# Patient Record
Sex: Female | Born: 1955
Health system: Southern US, Community
[De-identification: ages and names within clinical notes are randomized; demographics above are authoritative.]

## PROBLEM LIST (undated history)

## (undated) DIAGNOSIS — N1831 Chronic kidney disease, stage 3a: Secondary | ICD-10-CM

## (undated) DIAGNOSIS — E781 Pure hyperglyceridemia: Secondary | ICD-10-CM

## (undated) DIAGNOSIS — I739 Peripheral vascular disease, unspecified: Secondary | ICD-10-CM

## (undated) DIAGNOSIS — H9191 Unspecified hearing loss, right ear: Secondary | ICD-10-CM

## (undated) DIAGNOSIS — E119 Type 2 diabetes mellitus without complications: Secondary | ICD-10-CM

## (undated) DIAGNOSIS — I714 Abdominal aortic aneurysm, without rupture, unspecified: Secondary | ICD-10-CM

## (undated) DIAGNOSIS — K219 Gastro-esophageal reflux disease without esophagitis: Secondary | ICD-10-CM

## (undated) DIAGNOSIS — E785 Hyperlipidemia, unspecified: Secondary | ICD-10-CM

## (undated) DIAGNOSIS — I10 Essential (primary) hypertension: Secondary | ICD-10-CM

## (undated) DIAGNOSIS — I700 Atherosclerosis of aorta: Secondary | ICD-10-CM

## (undated) DIAGNOSIS — G43909 Migraine, unspecified, not intractable, without status migrainosus: Secondary | ICD-10-CM

## (undated) HISTORY — DX: Hyperlipidemia, unspecified: E78.5

## (undated) HISTORY — PX: APPENDECTOMY: SHX54

## (undated) HISTORY — PX: LAPAROTOMY: SHX154

## (undated) HISTORY — DX: Unspecified hearing loss, right ear: H91.91

## (undated) HISTORY — DX: Migraine, unspecified, not intractable, without status migrainosus: G43.909

## (undated) HISTORY — DX: Atherosclerosis of aorta: I70.0

## (undated) HISTORY — DX: Chronic kidney disease, stage 3a: N18.31

## (undated) HISTORY — DX: Peripheral vascular disease, unspecified: I73.9

## (undated) HISTORY — DX: Essential (primary) hypertension: I10

## (undated) HISTORY — DX: Abdominal aortic aneurysm, without rupture: I71.4

## (undated) HISTORY — PX: OTHER SURGICAL HISTORY: SHX169

## (undated) HISTORY — PX: ORTHOPEDIC SURGERY: SHX850

## (undated) HISTORY — DX: Abdominal aortic aneurysm, without rupture, unspecified: I71.40

## (undated) HISTORY — DX: Gastro-esophageal reflux disease without esophagitis: K21.9

## (undated) HISTORY — DX: Pure hyperglyceridemia: E78.1

---

## 1998-09-04 ENCOUNTER — Ambulatory Visit (HOSPITAL_COMMUNITY): Admission: RE | Admit: 1998-09-04 | Discharge: 1998-09-04 | Payer: Self-pay | Admitting: Obstetrics & Gynecology

## 1998-12-27 ENCOUNTER — Ambulatory Visit (HOSPITAL_BASED_OUTPATIENT_CLINIC_OR_DEPARTMENT_OTHER): Admission: RE | Admit: 1998-12-27 | Discharge: 1998-12-27 | Payer: Self-pay | Admitting: Orthopedic Surgery

## 1999-10-04 ENCOUNTER — Ambulatory Visit (HOSPITAL_COMMUNITY): Admission: RE | Admit: 1999-10-04 | Discharge: 1999-10-04 | Payer: Self-pay | Admitting: Obstetrics & Gynecology

## 1999-10-04 ENCOUNTER — Encounter: Payer: Self-pay | Admitting: Obstetrics & Gynecology

## 1999-11-12 ENCOUNTER — Other Ambulatory Visit: Admission: RE | Admit: 1999-11-12 | Discharge: 1999-11-12 | Payer: Self-pay | Admitting: Obstetrics & Gynecology

## 2000-02-26 ENCOUNTER — Encounter: Payer: Self-pay | Admitting: Family Medicine

## 2000-02-26 ENCOUNTER — Encounter: Admission: RE | Admit: 2000-02-26 | Discharge: 2000-02-26 | Payer: Self-pay | Admitting: Family Medicine

## 2000-10-06 ENCOUNTER — Encounter: Payer: Self-pay | Admitting: Obstetrics & Gynecology

## 2000-10-06 ENCOUNTER — Ambulatory Visit (HOSPITAL_COMMUNITY): Admission: RE | Admit: 2000-10-06 | Discharge: 2000-10-06 | Payer: Self-pay | Admitting: Obstetrics & Gynecology

## 2000-11-30 ENCOUNTER — Other Ambulatory Visit: Admission: RE | Admit: 2000-11-30 | Discharge: 2000-11-30 | Payer: Self-pay | Admitting: Obstetrics & Gynecology

## 2001-10-21 ENCOUNTER — Ambulatory Visit (HOSPITAL_COMMUNITY): Admission: RE | Admit: 2001-10-21 | Discharge: 2001-10-21 | Payer: Self-pay | Admitting: Obstetrics & Gynecology

## 2001-10-21 ENCOUNTER — Encounter: Payer: Self-pay | Admitting: Obstetrics & Gynecology

## 2001-10-28 ENCOUNTER — Encounter: Admission: RE | Admit: 2001-10-28 | Discharge: 2001-10-28 | Payer: Self-pay | Admitting: Urology

## 2001-10-28 ENCOUNTER — Encounter: Payer: Self-pay | Admitting: Urology

## 2001-12-23 ENCOUNTER — Other Ambulatory Visit: Admission: RE | Admit: 2001-12-23 | Discharge: 2001-12-23 | Payer: Self-pay | Admitting: Obstetrics & Gynecology

## 2002-05-18 ENCOUNTER — Encounter: Payer: Self-pay | Admitting: Family Medicine

## 2002-05-18 ENCOUNTER — Encounter: Admission: RE | Admit: 2002-05-18 | Discharge: 2002-05-18 | Payer: Self-pay | Admitting: Family Medicine

## 2002-08-11 ENCOUNTER — Encounter (INDEPENDENT_AMBULATORY_CARE_PROVIDER_SITE_OTHER): Payer: Self-pay | Admitting: *Deleted

## 2002-08-11 ENCOUNTER — Ambulatory Visit (HOSPITAL_COMMUNITY): Admission: RE | Admit: 2002-08-11 | Discharge: 2002-08-11 | Payer: Self-pay | Admitting: *Deleted

## 2002-11-02 ENCOUNTER — Encounter: Payer: Self-pay | Admitting: Obstetrics & Gynecology

## 2002-11-02 ENCOUNTER — Ambulatory Visit (HOSPITAL_COMMUNITY): Admission: RE | Admit: 2002-11-02 | Discharge: 2002-11-02 | Payer: Self-pay | Admitting: Obstetrics & Gynecology

## 2003-01-03 ENCOUNTER — Encounter: Admission: RE | Admit: 2003-01-03 | Discharge: 2003-01-03 | Payer: Self-pay | Admitting: *Deleted

## 2003-01-03 ENCOUNTER — Encounter: Payer: Self-pay | Admitting: *Deleted

## 2003-02-01 ENCOUNTER — Other Ambulatory Visit: Admission: RE | Admit: 2003-02-01 | Discharge: 2003-02-01 | Payer: Self-pay | Admitting: Obstetrics & Gynecology

## 2003-08-15 ENCOUNTER — Ambulatory Visit (HOSPITAL_COMMUNITY): Admission: RE | Admit: 2003-08-15 | Discharge: 2003-08-15 | Payer: Self-pay | Admitting: Family Medicine

## 2003-11-06 ENCOUNTER — Ambulatory Visit (HOSPITAL_COMMUNITY): Admission: RE | Admit: 2003-11-06 | Discharge: 2003-11-06 | Payer: Self-pay | Admitting: Obstetrics & Gynecology

## 2003-11-16 ENCOUNTER — Ambulatory Visit (HOSPITAL_COMMUNITY): Admission: RE | Admit: 2003-11-16 | Discharge: 2003-11-16 | Payer: Self-pay | Admitting: Obstetrics & Gynecology

## 2004-01-09 ENCOUNTER — Ambulatory Visit (HOSPITAL_COMMUNITY): Admission: RE | Admit: 2004-01-09 | Discharge: 2004-01-09 | Payer: Self-pay | Admitting: Obstetrics & Gynecology

## 2004-04-30 ENCOUNTER — Other Ambulatory Visit: Admission: RE | Admit: 2004-04-30 | Discharge: 2004-04-30 | Payer: Self-pay | Admitting: Obstetrics & Gynecology

## 2004-08-19 ENCOUNTER — Ambulatory Visit: Payer: Self-pay | Admitting: Internal Medicine

## 2004-09-25 ENCOUNTER — Ambulatory Visit: Payer: Self-pay | Admitting: Cardiology

## 2004-09-30 ENCOUNTER — Ambulatory Visit: Payer: Self-pay | Admitting: Cardiology

## 2004-11-13 ENCOUNTER — Ambulatory Visit (HOSPITAL_COMMUNITY): Admission: RE | Admit: 2004-11-13 | Discharge: 2004-11-13 | Payer: Self-pay | Admitting: Obstetrics & Gynecology

## 2004-12-06 ENCOUNTER — Ambulatory Visit: Payer: Self-pay | Admitting: Cardiology

## 2005-01-09 ENCOUNTER — Ambulatory Visit: Payer: Self-pay | Admitting: Cardiology

## 2005-02-24 ENCOUNTER — Ambulatory Visit: Payer: Self-pay | Admitting: Cardiology

## 2005-03-18 ENCOUNTER — Ambulatory Visit: Payer: Self-pay | Admitting: Cardiology

## 2005-04-07 ENCOUNTER — Ambulatory Visit: Payer: Self-pay | Admitting: Cardiology

## 2005-05-05 ENCOUNTER — Ambulatory Visit: Payer: Self-pay | Admitting: Internal Medicine

## 2005-06-09 ENCOUNTER — Other Ambulatory Visit: Admission: RE | Admit: 2005-06-09 | Discharge: 2005-06-09 | Payer: Self-pay | Admitting: Obstetrics & Gynecology

## 2005-06-19 ENCOUNTER — Ambulatory Visit: Payer: Self-pay | Admitting: Cardiology

## 2005-09-01 ENCOUNTER — Ambulatory Visit: Payer: Self-pay | Admitting: Cardiology

## 2005-09-06 ENCOUNTER — Emergency Department (HOSPITAL_COMMUNITY): Admission: EM | Admit: 2005-09-06 | Discharge: 2005-09-06 | Payer: Self-pay | Admitting: Emergency Medicine

## 2005-11-27 ENCOUNTER — Ambulatory Visit (HOSPITAL_COMMUNITY): Admission: RE | Admit: 2005-11-27 | Discharge: 2005-11-27 | Payer: Self-pay | Admitting: Obstetrics & Gynecology

## 2005-12-04 ENCOUNTER — Encounter: Admission: RE | Admit: 2005-12-04 | Discharge: 2005-12-04 | Payer: Self-pay | Admitting: Obstetrics & Gynecology

## 2006-01-21 ENCOUNTER — Ambulatory Visit: Payer: Self-pay

## 2006-07-01 ENCOUNTER — Encounter: Admission: RE | Admit: 2006-07-01 | Discharge: 2006-07-01 | Payer: Self-pay | Admitting: Interventional Radiology

## 2006-10-05 ENCOUNTER — Ambulatory Visit: Payer: Self-pay | Admitting: Cardiology

## 2006-10-05 LAB — CONVERTED CEMR LAB
ALT: 69 units/L — ABNORMAL HIGH (ref 0–40)
AST: 56 units/L — ABNORMAL HIGH (ref 0–37)
Albumin: 4 g/dL (ref 3.5–5.2)
Alkaline Phosphatase: 56 units/L (ref 39–117)
Bilirubin, Direct: 0.1 mg/dL (ref 0.0–0.3)
Chol/HDL Ratio, serum: 8.2
Cholesterol: 249 mg/dL (ref 0–200)
HDL: 30.5 mg/dL — ABNORMAL LOW (ref >39.0)
LDL DIRECT: 94.4 mg/dL
Total Bilirubin: 0.8 mg/dL (ref 0.3–1.2)
Total Protein: 6.6 g/dL (ref 6.0–8.3)
Triglyceride fasting, serum: 923 mg/dL (ref 0–149)
VLDL: 185 mg/dL — ABNORMAL HIGH (ref 0–40)

## 2006-10-15 ENCOUNTER — Ambulatory Visit: Payer: Self-pay | Admitting: Internal Medicine

## 2006-11-11 ENCOUNTER — Ambulatory Visit: Payer: Self-pay | Admitting: *Deleted

## 2006-11-11 LAB — CONVERTED CEMR LAB
ALT: 62 units/L — ABNORMAL HIGH (ref 0–40)
AST: 63 units/L — ABNORMAL HIGH (ref 0–37)
Albumin: 4.2 g/dL (ref 3.5–5.2)
Alkaline Phosphatase: 43 units/L (ref 39–117)
Bilirubin, Direct: 0.1 mg/dL (ref 0.0–0.3)
Cholesterol: 242 mg/dL (ref 0–200)
Direct LDL: 159.7 mg/dL
HDL: 29.3 mg/dL — ABNORMAL LOW (ref >39.0)
Total Bilirubin: 0.7 mg/dL (ref 0.3–1.2)
Total CHOL/HDL Ratio: 8.3
Total Protein: 7.2 g/dL (ref 6.0–8.3)
Triglycerides: 407 mg/dL (ref 0–149)
VLDL: 81 mg/dL — ABNORMAL HIGH (ref 0–40)

## 2006-11-16 ENCOUNTER — Ambulatory Visit: Payer: Self-pay | Admitting: Cardiology

## 2006-12-09 ENCOUNTER — Ambulatory Visit (HOSPITAL_COMMUNITY): Admission: RE | Admit: 2006-12-09 | Discharge: 2006-12-09 | Payer: Self-pay | Admitting: Obstetrics & Gynecology

## 2006-12-10 ENCOUNTER — Ambulatory Visit: Payer: Self-pay | Admitting: *Deleted

## 2006-12-10 LAB — CONVERTED CEMR LAB
ALT: 41 units/L — ABNORMAL HIGH (ref 0–40)
AST: 47 units/L — ABNORMAL HIGH (ref 0–37)
Albumin: 3.7 g/dL (ref 3.5–5.2)
Alkaline Phosphatase: 42 units/L (ref 39–117)
Bilirubin, Direct: 0.1 mg/dL (ref 0.0–0.3)
Cholesterol: 178 mg/dL (ref 0–200)
Direct LDL: 104.6 mg/dL
HDL: 24.9 mg/dL — ABNORMAL LOW (ref >39.0)
Total Bilirubin: 0.6 mg/dL (ref 0.3–1.2)
Total CHOL/HDL Ratio: 7.1
Total Protein: 6.6 g/dL (ref 6.0–8.3)
Triglycerides: 361 mg/dL (ref 0–149)
VLDL: 72 mg/dL — ABNORMAL HIGH (ref 0–40)

## 2006-12-14 ENCOUNTER — Ambulatory Visit: Payer: Self-pay | Admitting: Cardiology

## 2006-12-29 ENCOUNTER — Ambulatory Visit: Payer: Self-pay | Admitting: Internal Medicine

## 2006-12-29 LAB — CONVERTED CEMR LAB
ALT: 47 units/L — ABNORMAL HIGH (ref 0–40)
AST: 54 units/L — ABNORMAL HIGH (ref 0–37)
Albumin: 4.2 g/dL (ref 3.5–5.2)
Alkaline Phosphatase: 46 units/L (ref 39–117)
Anti Nuclear Antibody(ANA): NEGATIVE
BUN: 15 mg/dL (ref 6–23)
Basophils Absolute: 0.1 10*3/uL (ref 0.0–0.1)
Basophils Relative: 1.2 % — ABNORMAL HIGH (ref 0.0–1.0)
Bilirubin, Direct: 0.1 mg/dL (ref 0.0–0.3)
CO2: 30 meq/L (ref 19–32)
Calcium: 9.8 mg/dL (ref 8.4–10.5)
Ceruloplasmin: 57 mg/dL (ref 21–63)
Chloride: 106 meq/L (ref 96–112)
Creatinine, Ser: 0.8 mg/dL (ref 0.4–1.2)
Eosinophils Absolute: 0.1 10*3/uL (ref 0.0–0.6)
Eosinophils Relative: 1.4 % (ref 0.0–5.0)
Ferritin: 320.3 ng/mL — ABNORMAL HIGH (ref 10.0–291.0)
GFR calc Af Amer: 97 mL/min
GFR calc non Af Amer: 80 mL/min
Glucose, Bld: 130 mg/dL — ABNORMAL HIGH (ref 70–99)
HCT: 38.8 % (ref 36.0–46.0)
HCV Ab: NEGATIVE
Hemoglobin: 14.2 g/dL (ref 12.0–15.0)
Hep B S Ab: NEGATIVE
Hepatitis B Surface Ag: NEGATIVE
INR: 1 (ref 0.9–2.0)
Iron: 77 ug/dL (ref 42–145)
Lymphocytes Relative: 38.5 % (ref 12.0–46.0)
MCHC: 36.6 g/dL (ref 30.0–36.0)
MCV: 99.6 fL (ref 78.0–100.0)
Monocytes Absolute: 0.3 10*3/uL (ref 0.2–0.7)
Monocytes Relative: 3.4 % (ref 3.0–11.0)
Neutro Abs: 5.1 10*3/uL (ref 1.4–7.7)
Neutrophils Relative %: 55.5 % (ref 43.0–77.0)
Platelets: 280 10*3/uL (ref 150–400)
Potassium: 3.5 meq/L (ref 3.5–5.1)
Prothrombin Time: 12.1 s (ref 10.0–14.0)
RBC: 3.9 M/uL (ref 3.87–5.11)
RDW: 11.3 % — ABNORMAL LOW (ref 11.5–14.6)
Saturation Ratios: 15.9 % — ABNORMAL LOW (ref 20.0–50.0)
Sodium: 143 meq/L (ref 135–145)
TSH: 3.5 microintl units/mL (ref 0.35–5.50)
Total Bilirubin: 0.7 mg/dL (ref 0.3–1.2)
Total Protein: 6.9 g/dL (ref 6.0–8.3)
Transferrin: 345.5 mg/dL (ref >=212.0)
WBC: 9.1 10*3/uL (ref 4.5–10.5)

## 2007-01-04 ENCOUNTER — Ambulatory Visit: Payer: Self-pay | Admitting: Internal Medicine

## 2007-01-26 ENCOUNTER — Ambulatory Visit: Payer: Self-pay | Admitting: Internal Medicine

## 2007-05-25 ENCOUNTER — Ambulatory Visit: Payer: Self-pay | Admitting: Internal Medicine

## 2007-05-25 LAB — CONVERTED CEMR LAB
ALT: 43 units/L — ABNORMAL HIGH (ref 0–35)
AST: 44 units/L — ABNORMAL HIGH (ref 0–37)
Albumin: 4.1 g/dL (ref 3.5–5.2)
Alkaline Phosphatase: 40 units/L (ref 39–117)
Bilirubin, Direct: 0.1 mg/dL (ref 0.0–0.3)
Cholesterol: 201 mg/dL (ref 0–200)
Direct LDL: 118.4 mg/dL
HDL: 16.5 mg/dL — ABNORMAL LOW (ref >39.0)
Total Bilirubin: 0.6 mg/dL (ref 0.3–1.2)
Total CHOL/HDL Ratio: 12.2
Total Protein: 6.8 g/dL (ref 6.0–8.3)
Triglycerides: 637 mg/dL (ref 0–149)
VLDL: 127 mg/dL — ABNORMAL HIGH (ref 0–40)

## 2007-05-27 ENCOUNTER — Ambulatory Visit: Payer: Self-pay | Admitting: Cardiology

## 2007-07-16 ENCOUNTER — Ambulatory Visit: Payer: Self-pay | Admitting: Internal Medicine

## 2007-07-16 LAB — CONVERTED CEMR LAB
ALT: 37 units/L — ABNORMAL HIGH (ref 0–35)
AST: 44 units/L — ABNORMAL HIGH (ref 0–37)
Albumin: 4.2 g/dL (ref 3.5–5.2)
Alkaline Phosphatase: 36 units/L — ABNORMAL LOW (ref 39–117)
Bilirubin, Direct: 0.1 mg/dL (ref 0.0–0.3)
Cholesterol: 207 mg/dL (ref 0–200)
Direct LDL: 145.7 mg/dL
HDL: 22.1 mg/dL — ABNORMAL LOW (ref >39.0)
Total Bilirubin: 0.6 mg/dL (ref 0.3–1.2)
Total CHOL/HDL Ratio: 9.4
Total Protein: 7.1 g/dL (ref 6.0–8.3)
Triglycerides: 209 mg/dL (ref 0–149)
VLDL: 42 mg/dL — ABNORMAL HIGH (ref 0–40)

## 2007-10-15 ENCOUNTER — Ambulatory Visit: Payer: Self-pay | Admitting: Internal Medicine

## 2007-10-19 LAB — CONVERTED CEMR LAB
ALT: 40 units/L — ABNORMAL HIGH (ref 0–35)
AST: 48 units/L — ABNORMAL HIGH (ref 0–37)
Albumin: 4.2 g/dL (ref 3.5–5.2)
Alkaline Phosphatase: 31 units/L — ABNORMAL LOW (ref 39–117)
Bilirubin, Direct: 0.1 mg/dL (ref 0.0–0.3)
Cholesterol: 211 mg/dL (ref 0–200)
Direct LDL: 138.6 mg/dL
HDL: 23.8 mg/dL — ABNORMAL LOW (ref >39.0)
Total Bilirubin: 0.7 mg/dL (ref 0.3–1.2)
Total CHOL/HDL Ratio: 8.9
Total Protein: 7.3 g/dL (ref 6.0–8.3)
Triglycerides: 226 mg/dL (ref 0–149)
VLDL: 45 mg/dL — ABNORMAL HIGH (ref 0–40)

## 2007-10-21 ENCOUNTER — Ambulatory Visit: Payer: Self-pay | Admitting: Cardiology

## 2007-12-28 ENCOUNTER — Ambulatory Visit (HOSPITAL_COMMUNITY): Admission: RE | Admit: 2007-12-28 | Discharge: 2007-12-28 | Payer: Self-pay | Admitting: Obstetrics & Gynecology

## 2008-01-06 ENCOUNTER — Ambulatory Visit: Payer: Self-pay | Admitting: Cardiology

## 2008-01-06 LAB — CONVERTED CEMR LAB
ALT: 41 units/L — ABNORMAL HIGH (ref 0–35)
AST: 47 units/L — ABNORMAL HIGH (ref 0–37)
Albumin: 4.1 g/dL (ref 3.5–5.2)
Alkaline Phosphatase: 33 units/L — ABNORMAL LOW (ref 39–117)
Bilirubin, Direct: 0.1 mg/dL (ref 0.0–0.3)
Cholesterol: 198 mg/dL (ref 0–200)
Direct LDL: 138.5 mg/dL
HDL: 26 mg/dL — ABNORMAL LOW (ref >39.0)
Total Bilirubin: 0.5 mg/dL (ref 0.3–1.2)
Total CHOL/HDL Ratio: 7.6
Total CK: 203 units/L (ref 7–177)
Total Protein: 7.4 g/dL (ref 6.0–8.3)
Triglycerides: 208 mg/dL (ref 0–149)
VLDL: 42 mg/dL — ABNORMAL HIGH (ref 0–40)

## 2008-01-17 ENCOUNTER — Ambulatory Visit: Payer: Self-pay | Admitting: Cardiovascular Disease

## 2008-06-22 ENCOUNTER — Ambulatory Visit: Payer: Self-pay | Admitting: Internal Medicine

## 2008-06-23 ENCOUNTER — Ambulatory Visit: Payer: Self-pay | Admitting: Internal Medicine

## 2008-06-28 LAB — CONVERTED CEMR LAB
ALT: 30 units/L (ref 0–35)
AST: 37 units/L (ref 0–37)
Albumin: 4.2 g/dL (ref 3.5–5.2)
Alkaline Phosphatase: 30 units/L — ABNORMAL LOW (ref 39–117)
Bilirubin, Direct: 0.1 mg/dL (ref 0.0–0.3)
Cholesterol: 188 mg/dL (ref 0–200)
Direct LDL: 119.5 mg/dL
HDL: 24 mg/dL — ABNORMAL LOW (ref >39.0)
Total Bilirubin: 0.8 mg/dL (ref 0.3–1.2)
Total CHOL/HDL Ratio: 7.8
Total Protein: 7.3 g/dL (ref 6.0–8.3)
Triglycerides: 229 mg/dL (ref 0–149)
VLDL: 46 mg/dL — ABNORMAL HIGH (ref 0–40)

## 2008-06-29 ENCOUNTER — Ambulatory Visit: Payer: Self-pay | Admitting: Cardiology

## 2008-11-03 ENCOUNTER — Ambulatory Visit: Payer: Self-pay

## 2008-11-03 LAB — CONVERTED CEMR LAB
ALT: 31 units/L (ref 0–35)
AST: 34 units/L (ref 0–37)
Albumin: 4.3 g/dL (ref 3.5–5.2)
Alkaline Phosphatase: 34 units/L — ABNORMAL LOW (ref 39–117)
Bilirubin, Direct: 0.1 mg/dL (ref 0.0–0.3)
Cholesterol: 175 mg/dL (ref 0–200)
Direct LDL: 111.1 mg/dL
HDL: 22.2 mg/dL — ABNORMAL LOW (ref >39.0)
Total Bilirubin: 0.8 mg/dL (ref 0.3–1.2)
Total CHOL/HDL Ratio: 7.9
Total Protein: 7.4 g/dL (ref 6.0–8.3)
Triglycerides: 265 mg/dL (ref 0–149)
VLDL: 53 mg/dL — ABNORMAL HIGH (ref 0–40)

## 2008-11-09 ENCOUNTER — Ambulatory Visit: Payer: Self-pay | Admitting: Cardiology

## 2009-02-02 ENCOUNTER — Ambulatory Visit: Payer: Self-pay | Admitting: Cardiology

## 2009-02-07 LAB — CONVERTED CEMR LAB
ALT: 38 units/L — ABNORMAL HIGH (ref 0–35)
AST: 52 units/L — ABNORMAL HIGH (ref 0–37)
Albumin: 4.1 g/dL (ref 3.5–5.2)
Alkaline Phosphatase: 29 units/L — ABNORMAL LOW (ref 39–117)
Bilirubin, Direct: 0.2 mg/dL (ref 0.0–0.3)
Cholesterol: 144 mg/dL (ref 0–200)
Direct LDL: 91.2 mg/dL
HDL: 21.1 mg/dL — ABNORMAL LOW (ref >39.00)
Total Bilirubin: 0.7 mg/dL (ref 0.3–1.2)
Total CHOL/HDL Ratio: 7
Total Protein: 7.2 g/dL (ref 6.0–8.3)
Triglycerides: 270 mg/dL — ABNORMAL HIGH (ref 0.0–149.0)
VLDL: 54 mg/dL — ABNORMAL HIGH (ref 0.0–40.0)

## 2009-02-22 ENCOUNTER — Ambulatory Visit: Payer: Self-pay | Admitting: Internal Medicine

## 2009-02-22 DIAGNOSIS — M359 Systemic involvement of connective tissue, unspecified: Secondary | ICD-10-CM | POA: Insufficient documentation

## 2009-02-22 DIAGNOSIS — E782 Mixed hyperlipidemia: Secondary | ICD-10-CM | POA: Insufficient documentation

## 2009-02-22 DIAGNOSIS — E785 Hyperlipidemia, unspecified: Secondary | ICD-10-CM | POA: Insufficient documentation

## 2009-03-20 ENCOUNTER — Ambulatory Visit (HOSPITAL_COMMUNITY): Admission: RE | Admit: 2009-03-20 | Discharge: 2009-03-20 | Payer: Self-pay | Admitting: Obstetrics & Gynecology

## 2010-04-10 ENCOUNTER — Ambulatory Visit (HOSPITAL_COMMUNITY): Admission: RE | Admit: 2010-04-10 | Discharge: 2010-04-10 | Payer: Self-pay | Admitting: Obstetrics & Gynecology

## 2011-02-04 NOTE — Assessment & Plan Note (Signed)
[facility]                               LIPID CLINIC NOTE   NINI, CAVAN                        MRN:          [id number]  DATE:06/29/2008                            DOB:          1956-05-06    Ms. Malcolm comes in today for followup of her hyperlipidemia therapy  which includes TriCor 145 mg in the evening, fish oil 2 g every evening,  and Crestor 5 mg in the evening 3 times per week.  She has been  compliant with all previous therapies and was tolerating them with no  reports of muscle aches, pains, weakness, fatigue, etc.   Other medications have not changed that include prednisone, atenolol,  indapamide, Zoloft, Nexium, Cenestin, Neurontin, calcium with vitamin D,  multivitamin.   PHYSICAL EXAMINATION:  VITAL SIGNS:  Weight is 159 pounds, blood  pressure is 125/75, heart rate is 64.   LABORATORY DATA:  Total cholesterol 188, triglycerides 229, HDL 24, LDL  119.5.  LFTs are within normal limits.   ASSESSMENT:  Ms. Kittrell' LDL was slightly improved, but not yet at the  goal of less than 100.  Her triglycerides are about the same and not at  goal of less than 150.  Her HDL is not improved and is not at the goal  greater than 50.  She admits to not exercising at all, and her diet  though is not to bad she tries to maintain a heart healthy diet with  little or little to no fast food, chicken, fish, lots of salads, fresh  vegetables eating at home.  She does continue to smoke cigarettes.  She  also has wine pretty much on a daily basis.  Her stress level at home  has been getting little bit better with the adoption of her grandson.  She is interested in quitting smoking and is considering using nicotine  patches.   PLAN:  We will increase Crestor from 5 mg to 5 mg daily and hopes that  will improve all 3 for now rather not yet goal.  I encouraged her to  continue heart healthy diet and also I encouraged her to start walking  at least 3  times a week for 20-30 minutes at a time.  I encouraged her  to get out with her husband in the next Fall weather as well as pushing  the grandson in a stroller might be a good de-stressor in a way to  increase physical activity.  Followup with [place] will be in 4 months at  which time we will get a lipid and liver panel again and make any  changes in needs to  be made at that time and she was instructed to call us with any concerns  or problems in the meantime.      Charolotte Eke, PharmD  Electronically Signed      Rollene Rotunda, MD, [facility]  Electronically Signed   TP/MedQ  DD: 06/29/2008  DT: 06/30/2008  Job #: [id number]

## 2011-02-04 NOTE — Assessment & Plan Note (Signed)
[facility]                               LIPID CLINIC NOTE   Darlene Saunders, Darlene Saunders                        MRN:          [id number]  DATE:01/17/2008                            DOB:          10-31-1955    The patient was seen back in the lipid clinic for further evaluation of  medication titration associated with his hyperlipidemia in the setting  of a fatty liver and multiple medication intolerances.   The patient has been compliant with her therapies which include Crestor  3 times a week and has been feeling and doing well overall.  She has had  no muscle aches, pains, weakness, fatigue or other problems.  She  relates many stressful items that have occurred since our last visit  including the birth of her grandson in April.  The patient and her  husband have had significant levels of stress associated with this and  are working to identify more helpful habits.  The patient is currently  smoking approximately 15 cigarettes each day, an increase from her last  visit.   PAST MEDICAL HISTORY:  1. Pertinent for a fatty liver.  2. Pertinent for depression and the like.   CURRENT MEDICATIONS:  For cholesterol include 3 times a week Crestor 5  mg and TriCor 145 mg daily in the evening.   VITAL SIGNS:  Blood pressure today is 120/72.  Her heart rate is 68.  Her respirations are 17.  Her weight is 156.5 pounds.   The patient will follow up with questions or problems.  In the meantime,  I have asked her to identify how she can improve her healthy habits.   ASSESSMENT:  I have asked the patient to work on cutting down her  smoking, identifying how she is going to best quit.  We have discussed  patches and tablets amongst other therapies.  She will consider these in  the future.  In the meantime, I have asked her to continue her  therapies.  We will check her lipid panel later on but for now we will  only check her liver panel on June 8th at the [facility] lab.   We will do this  to verify that her liver functions are returning into the normal range  or staying consistent and that there is no further therapy modification  that we need to make to improve those.  She will follow up at that time.  I encouraged the patient to seek professional assistance such as  counseling with her increased stress at home with her new  grandchild and the grieving process associated with that.  She seems  very receptive to this plan and will follow up accordingly.  Time spent  with the patient is 45 minutes.      [name], PharmD, BCPS, CPP  Electronically Signed      Rollene Rotunda, MD, [facility]  Electronically Signed   MP/MedQ  DD: 01/17/2008  DT: 01/17/2008  Job #: [id number]   cc:   Wilhemina Bonito. Marina Goodell, MD

## 2011-02-04 NOTE — Assessment & Plan Note (Signed)
[facility]                               LIPID CLINIC NOTE   MURREL, FREET                        MRN:          [id number]  DATE:05/27/2007                            DOB:          01-Sep-1956    This is a return office visit for the lipid clinic.   PAST MEDICAL HISTORY:  1. Hyperlipidemia.  2. Hypertension.  3. Gastroesophageal reflux disease.  4. Chronic nonviral autoimmune disease.   MEDICATIONS:  1. Prednisone 2.5 mg every other day.  2. Atenolol 100 mg daily.  3. Indapamide 2.5 mg daily.  4. Zoloft 100 mg daily.  5. Nexium 40 mg daily.  6. Cenestin 1.25 mg twice daily.  7. Neurontin 800 mg 3 times a day.  8. Calcium with vitamin D daily.  9. Multivitamin daily.  10.Tricor 145 mg daily.  11.Fish oil 1 gm daily.   VITALS:  Weight 157 pounds.  Blood pressure 140/75.  Heart rate 70.   LAB DATA:  Total cholesterol 201, triglycerides 637, HDL 16.5, LDL 118,  AST 44, ALT 43.   ASSESSMENT:  Mr. Darlene Saunders is a pleasant woman who returns to the lipid  clinic today with no chest pain, no shortness of breath, no muscle aches  or pains.  She is compliant with a current medication regimen.  She had  started exercising over the summer by walking 30-45 minutes 4-5 times a  week; however, since school has restarted, she has decreased that to  about three days a week.  She is active in her garden, does lots of  gardening and digging outside.  She eats a fairly low fat, low  carbohydrate diet.  Cooks most of her evening meals but occasionally  fries.  Her biggest vice is cheese, which she tries to limit to 1 ounce  a day.  She also drinks one glass of wine each night of the week;  however, 2-3 times a week, she is drinking two glasses.  Given her  history with increased LFTs, I have encouraged her to decrease that to  one glass of wine a night.   Her total cholesterol is greater than a goal of less than 200.  Triglycerides greater than a goal of  less than 150.  HDL is less than a  goal of greater than 40.  LDL is greater than a goal of less than 100.   Given her history and her hypertriglyceridemia that is not responding to  Tricor therapy alone, I have asked her to increase her fish oil to 1  capsule three times daily with meals in anticipation of having to  increase this in the future.  She is agreeable to make medication  changes.  She also is agreeable to try a low dose statin once a week to  see if her body tolerates, and we will monitor her LFTs closely.   PLAN:  1. Continue exercise regimen.  2. Continue low fat diet, decreasing wine to one glass a night.  3. Continue exercise regimen.  4. Continue Tricor to 145 mg daily.  Add on  Crestor 5 mg once a week.  5. Follow-up visit in six weeks for lipid panel and LFTs and make      adjustments if needed at that time.      Leota Sauers, PharmD  Electronically Signed      Jesse Sans. Daleen Squibb, MD, [facility]  Electronically Signed   LC/MedQ  DD: 05/27/2007  DT: 05/27/2007  Job #: [id number]

## 2011-02-04 NOTE — Assessment & Plan Note (Signed)
[facility]                               LIPID CLINIC NOTE   Darlene Saunders, Darlene Saunders                        MRN:          [id number]  DATE:10/20/2008                            DOB:          05-May-1956    The patient is seen in Lipid Clinic for further evaluation and  medication titration associated with hyperlipidemia in the setting of  abnormal LFTs.  She has been doing well since her last visit.  She has  had no muscle aches, pain, weakness, or other issues.  She has ordered  to decrease her wine intake to some extent.  She increased her last  visit to Crestor 5 mg each day.  She has ordered to increase her heart-  healthy diet and is trying to walk though she has had increased stress.  She has been compliant with her meds.   PAST MEDICAL HISTORY:  Pertinent for hypertension and hyperlipidemia.   CURRENT MEDICATIONS:  Crestor 5 mg daily.   REVIEW OF SYSTEMS:  As stated in the HPI, otherwise negative.   PHYSICAL EXAMINATION:  VITAL SIGNS:  Weight today is 160 pounds, blood  pressure is 120/74, and heart rate is 68.   LABORATORY DATA:  Total cholesterol 188, triglycerides 229, HDL 24, LDL  119.5, and LFTs are within normal limits.   ASSESSMENT:  The patient is moving towards compliance with primary,  secondary, tertiary goals for lipid lowering therapy.  The patient will  call with questions or problems in the meantime.  She will follow up  with this in 3 months.  Continue to improve her diet and consider fish  oil.      [name], PharmD, BCPS, CPP  Electronically Signed      Rollene Rotunda, MD, [facility]  Electronically Signed   MP/MedQ  DD: 10/20/2008  DT: 10/21/2008  Job #: [id number]

## 2011-02-04 NOTE — Assessment & Plan Note (Signed)
[facility]                               LIPID CLINIC NOTE   GULIANNA, HORNSBY                        MRN:          [id number]  DATE:10/21/2007                            DOB:          10-15-1955    This is a return office visit for lipid clinic recheck.   PAST MEDICAL HISTORY:  Hyperlipidemia, hypertension, GERD, chronic  nonviral autoimmune disease, hypertriglyceridemia.   MEDICATIONS:  1. Prednisone 2.5 mg every other day.  2. Atenolol 100 mg daily.  3. Indapamide 2.5 mg daily.  4. Zoloft 100 mg daily.  5. Nexium 40 mg daily.  6. Cenestin 1.25 mg twice daily.  7. Neurontin 800 mg three times daily.  8. Zyrtec 10 mg daily.  9. TriCor 145 mg daily.  10.Calcium 600 mg with vitamin D 400 mg daily.  11.Multivitamin daily.  12.Crestor 5 mg twice weekly.  13.Gaviscon p.r.n.  14.Maxalt p.r.n.  15.Tylenol p.r.n.   Prescriptions filled at [facility] on [facility].   PHYSICAL EXAMINATION:  VITAL SIGNS:  Weight 154 pounds, blood pressure  120/72, heart rate 80.   LABORATORY DATA:  Total cholesterol 211, triglycerides 226, HDL 23.8,  LDL 138.  LFT's within normal limits.   ASSESSMENT:  The patient is a pleasant woman returning to the lipid  clinic today denying chest pain, shortness of breath, or muscle aches  and pains.  For the most part she is compliant, although has ran out of  Crestor for the last week or two.  Currently, otherwise taking TriCor  daily 145 mg and Fish Oil 1 gram daily for her lipid treatment,  currently all in the morning.  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  We discussed contemplation of quitting.  She also acknowledged drinking 1-2 glasses of wine daily.  She currently  eats a fairly low fat and low carbohydrate diet.  She cooks most meals  at home, enjoys grilling frequently, but from time to time enjoys frying  food for dinner.  She tries to behave for lunch with salads and  denies  fast food.  She also tries to [employer] like Smart Balance  canola oil and margarine spread.  She continues to claim cheese as her  biggest vice and does not get the amount of vegetable intake that she  should.  Given her LFT's and current lipid panel, I encouraged her not  to indulge in more than one glass of wine daily.   Her total cholesterol is again still greater than goal of 200,  triglycerides are still greater than goal of 150, HDL is still less than  goal of greater than 50, LDL is still greater than goal of 100.  Given  her history of hypertriglyceridemia, with the holidays she has fallen  off from exercise and diet has slacked.  She formerly was exercising 30-  40 minutes 4-5 days per week and now claims to not exercise at all.   PLAN:  Try to increase exercise to at least 30-40 minutes 2-3 times per  week.  Her husband has been trying to lose weight and they also have a  young lab so these are extra motivators for getting out and walking.   DIET:  Continue mainly heart healthy diet.  I encouraged her to try low  fat options like low fat spritzers on her salad and low fat and fat-free  feta cheese for flavoring.  I also encouraged her to try grilling fish  as another option to diversify her food.   MEDICATIONS:  Continue TriCor 145 mg daily, increase Fish Oil to 2 grams  at bedtime, and up her Crestor to 5 mg three days a week.  We gave her 7  samples and we will call in a prescription for more using a rebate  coupon.  The patient was not taking her cholesterol therapy in the  evenings previously.  We have reinforced the importance of that with  this visit and also the importance of taking her generic TriCor with  dinner.   Luan Pulling, PharmD dictating for [name]      [name], PharmD, BCPS, CPP  Electronically Signed      Luis Abed, MD, [facility]  Electronically Signed   MP/MedQ  DD: 10/21/2007  DT: 10/22/2007  Job #: [id number]    cc:   Thomas C. Wall, MD, [facility]

## 2011-02-04 NOTE — Assessment & Plan Note (Signed)
[facility]                               LIPID CLINIC NOTE   Darlene, Saunders                        MRN:          [id number]  DATE:11/09/2008                            DOB:          12/30/1955    Return office visit for Lipid Clinic.   PAST MEDICAL HISTORY:  1. Hypertension.  2. Hyperlipidemia.  3. Autoimmune disease.   CURRENT MEDICATIONS:  1. Prednisone 2.5 mg daily.  2. Atenolol 100 mg daily.  3. Indapamide 2.5 mg daily.  4. Zoloft 100 mg daily.  5. Nexium 40 mg daily.  6. __________ 1.25 mg daily.  7. Neurontin 800 mg twice daily.  8. Calcium with vitamin D twice daily.  9. TriCor 145 mg daily.  10.Fish oil 4 g at bedtime.  11.Multivitamin daily.  12.Crestor 5 mg daily.   VITAL SIGNS:  Weight 158 pounds, blood pressure 124/68, and heart rate  68.   LABORATORY DATA:  Total cholesterol 175, triglyceride 265, HDL 22.2, and  LDL 111.  LFTs within normal limits.   ASSESSMENT:  Darlene Saunders is a very pleasant woman, who returns to Lipid  Clinic today with no chest pain, no shortness of breath, no muscle aches  or pains.  She is compliant with current medication regimen.  She,  however, admits to being very noncompliant with an exercise regimen.  She says in the last year, she has exercised very little, but she has  recently adopted her grandchild, who is now at the time 4 year old, so  the last year has been very busy for her, trying to handle work and a  newborn child.  I have given her ideas of small ways to exercise at home  where during the winter where she is unable to  get outside and walk as  she has previously done.  I have recommended for her to walk the steps  whenever.  She is a Tax adviser.  I have encouraged her to walk steps  when she is at school, to walk the hallways, and take long ways around  from classroom where she has seen the children to back to her office.  I  have also encouraged her to in the evening time  after her grandchild is  in bed, to walk or run her steps in her home.  She starts at 3 minutes  and maybe do some jumping jacks or sit ups in between and increase the  time that she is going up and down the steps as time goes on.  I have  also encouraged her once the weather is little warmer, put the baby on  her shoulder and take her walks as she had been previously doing with  her husband prior to this lifestyle change.  She states that she has  been very noncompliant with a low-fat, low-carbohydrate diet.  She says  she has really increased her carbohydrates per se comfort foods during  the winter months.  She also has increased the amount of snacking and  snack foods that she has at home.  We  have checked a few alternatives  for her to have better healthier alternatives instead of some of the  junk food that she has been eating.  I have also encouraged her to  decrease the carbohydrates on her plate, take small portions, put the  rest of it in the refrigerator as to not to be able to go back for  seconds and to increase the amount of vegetables in her diet.  She does  seem to be willing and wanting to make these small changes in her  lifestyle.  She is hesitant to add more medication to her current  profile, but my next idea is to add niacin to decrease triglycerides,  increase HDL and have marginal effect on LDL at her next visit if she  does not make some significant changes in the next 3 months.   PLAN:  1. Continue current medication with the possibility of niacin next      visit.  2. Decrease carbohydrates and snacking in diet and increase vegetables      and fruit.  3. Increase exercise.  4. Followup visit in 3 months for lipid panel and LFTs and make      adjustments at that time.      Leota Sauers, PharmD  Electronically Signed      Jesse Sans. Daleen Squibb, MD, [facility]  Electronically Signed   LC/MedQ  DD: 11/09/2008  DT: 11/10/2008  Job #: [id number]

## 2011-02-07 NOTE — Op Note (Signed)
NAME:  Darlene Saunders, Darlene Saunders NO.:  [id number]   MEDICAL RECORD NO.:  [id number]                   PATIENT TYPE:  AMB   LOCATION:  [facility]                                  FACILITY:  [facility]   PHYSICIAN:  Freddy Finner, M.D.                DATE OF BIRTH:  [birth year]   DATE OF PROCEDURE:  01/09/2004  DATE OF DISCHARGE:                                 OPERATIVE REPORT   PREOPERATIVE DIAGNOSES:  1. Persistent right pelvic pain.  2. Paresthesias of right anterior thigh.   POSTOPERATIVE DIAGNOSES:  1. Pelvic adhesions.  2. Lower abdominal adhesions.   OPERATIVE PROCEDURES:  1. Extensive lysis of lower abdominal midline omental adhesions.  2. Lysis of adhesions binding the cecum to the right pelvic brim.  3. Careful exploration of the cul-de-sac and pelvis with no abnormal     findings deep in the pelvis.   Intraoperative findings are recorded in still photographs retained in the  office record.   ANESTHESIA:  General endotracheal.   ESTIMATED INTRAOPERATIVE BLOOD LOSS:  Less than or equal to 10 mL.   INTRAOPERATIVE COMPLICATIONS:  None.   The patient is a 55 year old white married female who has had a long history  of pelvic pain and previous endometriosis and previous complete hysterectomy  and bilateral salpingo-oophorectomy, I think in separate procedures.  She  had laparoscopy on March 10 of this year with findings of pelvic adhesions  and minimal evidence of recurrent endometriosis.  Those lesions were treated  intraoperatively at that time.  Postoperatively the patient initially made a  slight improvement but then fairly rapidly within the first two weeks of  surgery had additional complaints of paresthesias of the anterior thigh and  progressively increasing pelvic pain on the right side.  Options of therapy  were discussed with her, including radiological transcutaneous aspiration of  what appeared to be a small hematoma in the pelvis versus repeat  laparoscopy.  We elected to proceed with the later.  The patient is now  admitted for that purpose.   She was admitted on the morning of surgery, brought to the operating room  and placed under adequate general anesthesia, placed in the dorsal lithotomy  position.  Betadine prep was carried out in the usual fashion.  The bladder  was evacuated using sterile technique.  Sterile drapes were applied.  Two  small incisions were made, one at the umbilicus, one just above the  symphysis.  These were made through old scars from the previous surgery.  An  11 mm nonbladed disposable trocar was introduced at the umbilicus.  Direct  inspection revealed the findings as noted above.  Using the Endoshears with  unipolar cautery, attention was first turned to the omental adhesions to the  anterior abdominal wall.  There was a small residual component to this above  and to the left of the umbilicus, which could  not be adequately reached  without an additional abdominal puncture wound.  It was elected to leave  these in place, but everything below the umbilicus was carefully dissected  free of the abdominal wall.  Bipolar fulguration was then used to attain  complete hemostasis of this dissected field.  The cecum was tented to the  right pelvic sidewall, and this too was released and the area of this  adhesion carefully fulgurated with bipolar forceps.  The [place] irrigating  system was used through the lower port.  Copious irrigation was carried out.  Hemostasis was confirmed.  This was confirmed under irrigating solution and  under reduced intra-abdominal pressure.  All instruments were then removed.  Great care was taken to try to remove all of the gas from the abdomen that  was possible, and this included applying the suction to the relatively empty  abdominal cavity at the conclusion of the procedure.  All instruments were  then removed.  Incision sites were anesthetized with 0.25% plain Marcaine.  A  total of 10 mL was used.  Skin incisions were closed with interrupted  subcuticular sutures of 3-0 Dexon.  Steri-Strips were applied over the lower  incision.  The patient was awakened and taken to recovery in good condition.                                               Freddy Finner, M.D.    WRN/MEDQ  D:  01/09/2004  T:  01/09/2004  Job:  [id number]

## 2011-02-07 NOTE — Assessment & Plan Note (Signed)
[facility]                         GASTROENTEROLOGY OFFICE NOTE   EMILIANA, BLAIZE                        MRN:          [id number]  DATE:12/29/2006                            DOB:          [birth year]    REFERRING PHYSICIAN:  Rollene Rotunda, MD, [facility]   REASON FOR CONSULTATION:  Abnormal liver function test.   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 Rollene Rotunda and  [name] regarding elevated hepatic transaminases.  Reviewing the  patient's records, she has had chronic mild elevation of her hepatic  transamination dating back to at least 2003.  These are generally a few  points above normal.  Other liver tests including alkaline phosphatase,  total bilirubin have been normal; as well her protein and albumin have  been normal.  Her weight has been fairly steady with only a 5 pound  weight gain over the past 5 years.  She does consume a few glasses of  wine every evening.  She has been on a number of different lipid  lowering agents over the years.  Her gastrointestinal history is  remarkable for reflux disease and diarrhea predominant irritable bowel  syndrome, for which she has undergone a prior evaluation with Dr. Althea Grimmer. [place].  Upper endoscopy and colonoscopy were performed August 11, 2002 with Dr. Luther Parody.  Colonoscopy including intubation of the  ilium was normal.  Random biopsies of the colon were normal.  Upper  endoscopy was also normal.  Negative biopsies of the mucosal Z-line.  For her reflux she has been maintained on Nexium.  This works.  For her  irritable bowel Robinul forte p.r.n.  The patient denies personal  history of hepatitis or liver disease.  No prior transfusions.  No  family history of  liver problems.   PAST MEDICAL HISTORY:  As above.  In addition, questionable autoimmune  disorder, though this is quite unclear.   PAST SURGICAL HISTORY:  1. Hysterectomy.  2. Appendectomy.  3. Surgery related to infertility.   ALLERGIES:  SEPTRA.   CURRENT MEDICATIONS:  1. Prednisone 2.5 mg every other day.  2. Atenolol 100 mg daily.  3. Indapamide 2.5 mg daily.  4. Zoloft 100 mg daily.  5. Nexium 40 mg daily.  6. Cenestin 1.25 mg b.i.d.  7. Neurontin 800 mg t.i.d.  8. Zyrtec 10 mg p.r.n.  9. Tricor 145 mg daily.  10.Calcium with D.  11.Robinul forte.  12.Tylenol p.r.n.  13.Maxalt p.r.n.  14.Gaviscon p.r.n.   FAMILY HISTORY:  No family history of liver disease.  Father with heart  disease.   SOCIAL HISTORY:  The patient is married with one daughter.  She lives  with her husband.  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  She smokes.  She has several glasses of wine per evening.   REVIEW OF SYSTEMS:  Per diagnostic evaluation form.   PHYSICAL EXAMINATION:  A well appearing female in acute distress.  Blood pressure is 112/76, heart rate 72, weight is 156.8 pounds, she is  5 feet 5 inches in height.  HEENT:  Sclerae anicteric, conjunctivae are pink, oral mucosa is intact,  there is no adenopathy.  LUNGS:  Clear.  HEART:  Regular.  ABDOMEN:  Soft without tenderness, mass or hernial, no organomegaly,  good bowel sounds heard.  EXTREMITIES:  Without edema.  SKIN:  Normal with no evidence of spider angioma.   IMPRESSION:  1. Chronic mild elevation of hepatic transamination.  Statistically,      in this patient, this is most likely secondary to fatty liver      disease or possibly alcohol.  However, other chronic conditions      should be excluded.  2. Gastroesophageal reflux disease.  Negative endoscopy in 2003.      Symptoms controlled on Nexium.  3. Diarrhea predominant irritable bowel.  Negative colonoscopy with      biopsies in 2003.  Currently on  Robinul.  4. Multiple general medical problems.   RECOMMENDATIONS:  1. Expand work up of chronically elevated hepatic transaminases to      include chronic viral and non-viral studies.  As well studies to      evaluate hepatic synthetic function.  2. Schedule hepatic ultrasound to evaluate the liver echotexture.  3. Office follow up in about one month to review the above studies and      discuss further plans.  4. Continue Nexium for reflux.     Wilhemina Bonito. Marina Goodell, MD  Electronically Signed    JNP/MedQ  DD: 12/31/2006  DT: 12/31/2006  Job #: [id number]   cc:   Rollene Rotunda, MD, [facility]  [name], PharmD, BCPS, CPP  Dario Guardian, M.D.

## 2011-02-07 NOTE — Assessment & Plan Note (Signed)
 [facility]                         GASTROENTEROLOGY OFFICE NOTE   Darlene, Saunders                        MRN:          [id number]  DATE:01/26/2007                            DOB:          1956/07/16    HISTORY:  Darlene Saunders presents today for followup.  She is a 55 year old  who was evaluated on December 29, 2006 for mildly abnormal hepatic  transaminases.  See that dictation for details.  Her workup was  expanded.  CBC was unremarkable with normal blood counts and a normal  MCV.  Her prothrombin time was normal.  Liver function tests were  minimally abnormal with an SGOT of 54 and SGPT of 47.  Alkaline  phosphatase, bilirubin, protein, and albumin were normal.  There was no  evidence of iron storage disease.  TSH was normal.  Chronic viral  studies were negative.  Chronic nonviral and autoimmune studies were  negative.  Abdominal ultrasound revealed severely increased echodensity  of the liver.  She presents today for followup.  I have reviewed these  findings.   Her current medications are prednisone, atenolol, indapamide, Zoloft,  Nexium, Cenestin, Neurontin, and calcium.   PHYSICAL EXAMINATION:  GENERAL:  Well-appearing female in no acute  distress.  VITAL SIGNS:  Blood pressure 146/72, heart rate 74.  Weight is 157.6  pounds.   IMPRESSION:  Mild chronic elevation of hepatic transaminases.  Negative  laboratory workup.  Increased hepatic echodensity on ultrasound.  The  most likely cause for her elevated liver tests are either chronic  alcohol use,fatty liver, or a combination. Supporting alcohol is the  transaminase pattern and history of consumption of several glasses of  wine per evening. Risk factors for fatty liver disease  include being  overweight,gender,and dyslipidemia.Biopsy may or may not distinguish and  would unlikely change management.   RECOMMENDATIONS:  I have asked the patient to strictly curb her alcohol  use to less than  one glass of wine per day.  I have also discussed with  her the importance of weight loss.  We discussed an initial goal of 15  pounds over the next six months (rapid weight loss can actually  exacerbate fatty liver diseae).  In terms of her lipids, these should be  treated and blood work monitorred as per the norm.  She should have  repeat liver function tests in six months after complying with the above  measures(sooner if they are part of routine monitoring for lipid lower  medication).This will be left to the discretion of [name].  The  patient understands the recommendations as outlined.     Wilhemina Bonito. Marina Goodell, MD  Electronically Signed   JNP/MedQ  DD: 01/26/2007  DT: 01/26/2007  Job #: [id number]   cc:   Rollene Rotunda, MD, [facility]  [name], PharmD, BCPS, CPP  Dario Guardian, M.D.

## 2011-02-07 NOTE — Assessment & Plan Note (Signed)
[facility]                               LIPID CLINIC NOTE   Darlene Saunders, Darlene Saunders                        MRN:          [id number]  DATE:11/16/2006                            DOB:          Jul 04, 1956    Darlene Saunders is a very pleasant patient, seen back in the lipid clinic  for further evaluation and medication titration, associated with her  hyperlipidemia and hypertriglyceridemia.  The patient has been referred  historically by Dr. Jennette Kettle.  She has been taking her Tricor 145 mg daily  for the past four weeks.  She has not missed any doses.  She states she  has been fatigued, but does not relate this to her medications.  She has  increased her wheat bread intake, instead of white, since her last  visit, and has been walking her dog three times a week for at least 30  minutes a day.  She is continuing to have intake of approximately two  glasses of wine each evening.   PAST MEDICAL HISTORY:  Pertinent for hyperlipidemia and  hypertriglyceridemia.  An autoimmune disorder, being treated with  prednisone, which has now been decreased to 2.5 mg every other day.  LFT  elevations in the past.   CURRENT MEDICATIONS INCLUDE:  1. Prednisone 2.5 mg every other day.  2. Atenolol 100 mg daily.  3. Indapamide 2.5 mg daily.  4. Zoloft 100 mg daily.  5. Nexium 40 mg daily.  6. Cenestin 1.25 mg twice daily.  7. Neurontin 800 mg three times daily.  8. Zyrtec 10 mg every day as needed.  9. Tricor 145 mg daily.   REVIEW OF SYSTEMS:  As stated in HPI, and otherwise negative.   PHYSICAL EXAM:  Weight today is 159 pounds, blood pressure is 120/74,  heart rate is 68.   LABORATORY DATA:  From February 20 of this year reveal total cholesterol  242, triglycerides improved at 407, HDL 29.3, LDL 159.7.  LFTs remain  elevated with an AST of 63 and an ALT of 62.   ASSESSMENT:  The patient does have chronically elevated LFTs and have  suggested that she see GI after last  visit and she wanted to wait one  more time to see if labs were improved, but at this time is willing to  go for GI consultation.   PLAN:  Dietary therapy.  The patient will continue on diet modification  to reduce her hypertriglyceridemia.  The patient will also work to  decrease her wine by a half glass to a glass a day and I have suggested  drinking water from the wine glass to decrease craving for that further.  I have asked the patient to increase her walking to 30 minutes dedicated  activity three times each week.  The patient will continue her Tricor,  will consider fish oil addition next time and await GI indication as to  other therapy, limitations or recommendations.   I appreciate the opportunity to see this nice patient.      Darlene Saunders, PharmD, BCPS, CPP  Electronically Signed  Rollene Rotunda, MD, [facility]  Electronically Signed   MP/MedQ  DD: 11/27/2006  DT: 11/27/2006  Job #: [id number]   cc:   Wilhemina Bonito. Marina Goodell, MD

## 2011-02-07 NOTE — Assessment & Plan Note (Signed)
[name]                               LIPID CLINIC NOTE   Darlene Saunders, Darlene Saunders                          MRN:          [id number]  DATE:10/19/2006                            DOB:          Mar 01, 1956    The patient is seen in the lipid clinic for further evaluation and  medication titration associated with her hyperlipidemia and  hypertriglyceridemia.  The patient has been seen after about a 27-month  absence.  She had discontinued all of her cholesterol-lowering therapies  back before the summer of last year due to some muscle aches and when I  spoke with her back in June wanted to call me back for a followup  appointment, and thus did so prior to today's visit.  She states she has  been feeling and doing well overall.  She has been seen by Dr. Corliss Skains  who has diagnosed an autoimmune disorder that is not clear to me what  that is; I have requested records.  She is now taking prednisone 5 mg  every-other day and was 5 mg daily since August that has now been  recently decreased.  The patient was diagnosed with this after an ankle  fracture in December 2006.  The following summer she experienced  bilateral leg and knee swelling, was ruled out for rheumatoid arthritis  and gout, but did have good response from prednisone and thus continues  under Dr. Fatima Sanger care.   The patient's past medical history is pertinent for positive family  history for coronary disease, hypertriglyceridemia, periodic elevation  in LFTs.   CURRENT MEDICATIONS:  1. Atenolol 100 mg daily.  2. Indapamide 2.5 mg daily.  3. Zoloft 100 mg daily.  4. Nexium 40 mg daily.  5. Cenestin 1.25 mg one to two times daily.  6. Neurontin 800 mg three times daily.  7. Zyrtec 10 mg daily as needed.  8. Prednisone 5 mg every-other day.   REVIEW OF SYSTEMS:  Is as stated in HPI and otherwise negative.   DRUG ALLERGIES:  Include SEPTRA which she gets welts.   PHYSICAL EXAMINATION:   Weight today is 156 pounds, blood pressure is  125/75, heart rate is 72.   Labs have been forwarded to the chart.   ASSESSMENT:  The patient needs therapy again for her  hypertriglyceridemia.  I will begin TriCor 145 mg daily.  She will work  to decrease her wine intake from two glasses to one and she will improve  on diet and exercise therapy.  She will call me with questions or  problems in  the meantime or if myalgias.  Will recheck her in 4 weeks and at that  time make further assessment as to what we need to do.      [name], PharmD, BCPS, CPP  Electronically Signed      Rollene Rotunda, MD, [facility]  Electronically Signed   MP/MedQ  DD: 11/27/2006  DT: 11/27/2006  Job #: [id number]

## 2011-04-17 ENCOUNTER — Other Ambulatory Visit: Payer: Self-pay | Admitting: Obstetrics & Gynecology

## 2011-04-17 DIAGNOSIS — R928 Other abnormal and inconclusive findings on diagnostic imaging of breast: Secondary | ICD-10-CM

## 2011-04-30 ENCOUNTER — Other Ambulatory Visit: Payer: Self-pay | Admitting: Obstetrics & Gynecology

## 2011-04-30 ENCOUNTER — Ambulatory Visit
Admission: RE | Admit: 2011-04-30 | Discharge: 2011-04-30 | Disposition: A | Discharge status: Home / Self Care | Payer: 59 | Source: Ambulatory Visit | Attending: Obstetrics & Gynecology | Admitting: Obstetrics & Gynecology

## 2011-04-30 ENCOUNTER — Other Ambulatory Visit: Payer: Self-pay | Admitting: Radiology

## 2011-04-30 DIAGNOSIS — R928 Other abnormal and inconclusive findings on diagnostic imaging of breast: Secondary | ICD-10-CM

## 2011-05-01 ENCOUNTER — Ambulatory Visit
Admission: RE | Admit: 2011-05-01 | Discharge: 2011-05-01 | Disposition: A | Discharge status: Home / Self Care | Payer: 59 | Source: Ambulatory Visit | Attending: Obstetrics & Gynecology | Admitting: Obstetrics & Gynecology

## 2011-05-01 DIAGNOSIS — R928 Other abnormal and inconclusive findings on diagnostic imaging of breast: Secondary | ICD-10-CM

## 2012-04-28 ENCOUNTER — Ambulatory Visit (HOSPITAL_COMMUNITY)
Admission: RE | Admit: 2012-04-28 | Discharge: 2012-04-28 | Disposition: A | Discharge status: Home / Self Care | Payer: 59 | Source: Ambulatory Visit | Attending: Rheumatology | Admitting: Rheumatology

## 2012-04-28 ENCOUNTER — Other Ambulatory Visit (HOSPITAL_COMMUNITY): Payer: Self-pay | Admitting: Rheumatology

## 2012-04-28 DIAGNOSIS — F17200 Nicotine dependence, unspecified, uncomplicated: Secondary | ICD-10-CM

## 2013-04-25 ENCOUNTER — Other Ambulatory Visit: Payer: Self-pay | Admitting: Dermatology

## 2013-07-20 ENCOUNTER — Telehealth: Payer: Self-pay | Admitting: Cardiology

## 2013-07-20 MED ORDER — PITAVASTATIN CALCIUM 4 MG PO TABS: 4.0000 mg | ORAL_TABLET | Freq: Every day | ORAL | Status: DC

## 2013-07-20 NOTE — Telephone Encounter (Signed)
New problem    Pt would like you to call  [facility]  [employer] brand Livalo  In.    Give her a call if you have any question.   Thank you!

## 2013-07-20 NOTE — Telephone Encounter (Signed)
Called patient back and she needs Livalo sent to [facility].  She is tolerating Livalo 2 mg qd well.  Used up samples, and filled a 30 day supply at local pharmacy.  Now need mail order.   Would like to try Livalo 4 mg daily.  She will cut it back to 2 mg if any side effects.  Rx sent in.

## 2013-08-15 ENCOUNTER — Other Ambulatory Visit: Payer: Self-pay | Admitting: *Deleted

## 2013-08-15 DIAGNOSIS — E782 Mixed hyperlipidemia: Secondary | ICD-10-CM

## 2013-09-27 ENCOUNTER — Other Ambulatory Visit: Payer: 59

## 2014-01-04 ENCOUNTER — Other Ambulatory Visit (INDEPENDENT_AMBULATORY_CARE_PROVIDER_SITE_OTHER): Payer: 59

## 2014-01-04 DIAGNOSIS — E782 Mixed hyperlipidemia: Secondary | ICD-10-CM

## 2014-01-04 LAB — ALT: ALT: 28 U/L (ref 0–35)

## 2014-01-05 LAB — NMR LIPOPROFILE WITH LIPIDS
Cholesterol, Total: 183 mg/dL (ref <200)
HDL Particle Number: 16.7 umol/L — ABNORMAL LOW (ref >=30.5)
HDL Size: 8.3 nm — ABNORMAL LOW (ref >=9.2)
HDL-C: 24 mg/dL — ABNORMAL LOW (ref >=40)
LDL (calc): 106 mg/dL — ABNORMAL HIGH (ref <100)
LDL Particle Number: 2392 nmol/L — ABNORMAL HIGH (ref <1000)
LDL Size: 19.6 nm — ABNORMAL LOW (ref >20.5)
LP-IR Score: 67 — ABNORMAL HIGH (ref <=45)
Large HDL-P: <1.3 umol/L — ABNORMAL LOW (ref >=4.8)
Large VLDL-P: 3.5 nmol/L — ABNORMAL HIGH (ref <=2.7)
Small LDL Particle Number: 2099 nmol/L — ABNORMAL HIGH (ref <=527)
Triglycerides: 264 mg/dL — ABNORMAL HIGH (ref <150)
VLDL Size: 44.2 nm (ref <=46.6)

## 2014-01-10 ENCOUNTER — Ambulatory Visit (INDEPENDENT_AMBULATORY_CARE_PROVIDER_SITE_OTHER): Payer: 59 | Admitting: Pharmacist

## 2014-01-10 VITALS — Wt 158.0 lb

## 2014-01-10 DIAGNOSIS — E782 Mixed hyperlipidemia: Secondary | ICD-10-CM

## 2014-01-10 DIAGNOSIS — Z79899 Other long term (current) drug therapy: Secondary | ICD-10-CM

## 2014-01-10 MED ORDER — PITAVASTATIN CALCIUM 4 MG PO TABS: 4.0000 mg | ORAL_TABLET | Freq: Every day | ORAL | Status: DC

## 2014-01-10 NOTE — Assessment & Plan Note (Addendum)
Patient instructed to increase Livalo to 4 mg qd given elevated LDL-P number.  If she develops muscle aches on this, she should reduce back to livalo 2 mg qd again.  Continue to limit alcohol intake.  We discussed nicotine patch.  She will think about it.  If she proceeds with this, she understands to start with 14 mg x 8 weeks since she is currently getting ~ 10-12 mg of nicotine daily.  She would then taper to 7 mg patch for 2-3 weeks after this, and then stop.  She knows this will help with cravings, but she needs to work on her triggers.  Will continue all other meds, and recheck NMR and LFTs in 4 months. Plan: 1.  Increase Livalo to 4 mg - 1 pill daily 2.  Continue fenofibrate with largest meal of the day 3.  Continue fish oil 6 per day. 4.  Try nicotine patch - 14 mg patch for 8 weeks, then reduce to 7 mg patch for 2-3 weeks. 5.  Recheck cholesterol in 4 months (05/08/14 - fasting labs) and see Riki RuskJeremy 3 days later on 05/11/14 at 9:00 am

## 2014-01-10 NOTE — Patient Instructions (Signed)
1.  Increase Livalo to 4 mg - 1 pill daily 2.  Continue fenofibrate with largest meal of the day 3.  Continue fish oil 6 per day. 4.  Try nicotine patch - 14 mg patch for 8 weeks, then reduce to 7 mg patch for 2-3 weeks. 5.  Recheck cholesterol in 4 months (05/08/14 - fasting labs) and see Riki RuskJeremy 3 days later on 05/11/14 at 9:00 am

## 2014-01-10 NOTE — Progress Notes (Signed)
Patient is a pleasant 58 y.o. WF who is was referred to Dr. Anne FuSkains and lipid clinic by Dr. Merri Brunetteandace Smith a few years ago.  She is here for follow-up.  She has a h/o TG > 1800 mg/dL which improved after stopping oral estrogen, reducing alcohol, and starting fish oil and fenofibrate.  She has an elevated LDL-P number, which was > 29563000 nmol/L late last year.  She has failed multiple statins.  Her father recently passed away which has increased her stress level.  Despite this, patient has reduced her alcohol significantly, and has cut down on cigarettes to ~ 10 per day.  She would like to quit all together and asks about nicotine patches today.  Both her father and aunt have a h/o AAA, but patient was screened for this and it was negative.  She is tolerating Livalo 2 mg qd and fenofibrate + fish oil well today, and LDL-P number has improved over past 6 months.  LDL-P number is still > 2000 nmol/L and would like to get to < 1300.  RF:  Tobacco use, HDL, low HDL - LDL goal < 213130, LDL-P number goal < 1300 Meds:  Fenofibrate 160 mg with meal, fish oil 6 g/d, Livalo 2 mg qd Intolerant:  Crestor, Lipitor (both caused muscle aches)  Diet:  She has been eating a low fat diet, and over past few months has cut her wine intake down from 7-10 glasses per week, down to virtually none.   Exercise:  She is walking more for exercise now since her knee surgery last year (10/2012) Smoking:  She is smoking 10 cigarettes per day.  Failed Wellbutrin in the past due to insomnia.  Reluctant to use Chanitx.  Asks about nicotine patches today. Social history:  Daughter and grandson both live at home with patient which has been stressful.  Also her father recently passed away.  Alcohol intake is down significantly.  Labs: 12/2013:  LDL-P number 2392, TC 183, LDL 106, TG 264, HDL 24 (Fenofibrate 160 mg qd, fish oil 6 g/d, Livalo 2 mg qd)

## 2014-04-10 ENCOUNTER — Telehealth: Payer: Self-pay

## 2014-04-10 NOTE — Telephone Encounter (Signed)
Ok to refill since pt continues to see Riki RuskJeremy in the lipid clinc.

## 2014-04-27 ENCOUNTER — Other Ambulatory Visit: Payer: Self-pay | Admitting: Pharmacist

## 2014-04-27 MED ORDER — FENOFIBRATE 160 MG PO TABS
160.0000 mg | ORAL_TABLET | Freq: Every day | ORAL | Status: DC
Start: 2014-04-27 — End: 2015-04-27

## 2014-05-03 ENCOUNTER — Telehealth: Payer: Self-pay | Admitting: Pharmacist

## 2014-05-03 NOTE — Telephone Encounter (Signed)
New problem   Pt need to talk to [place]ok[place]Jeremy concerning her Fenofibrate b/c she hasn't start taking it yet because her insurance is so high. Please call pt.

## 2014-05-03 NOTE — Telephone Encounter (Signed)
Patient has been out of fenofibrate for 30 days as ran out of refills.  She should be receiving it from mail order this week.  Was due to get labs and see me next week, but wanted to r/s until she is back on meds.  Rescheduled lab and appointment for 8 weeks.

## 2014-05-03 NOTE — Telephone Encounter (Signed)
Left message to call back  

## 2014-05-08 ENCOUNTER — Other Ambulatory Visit: Payer: 59

## 2014-05-11 ENCOUNTER — Ambulatory Visit: Payer: 59 | Admitting: Pharmacist

## 2014-06-29 ENCOUNTER — Other Ambulatory Visit: Payer: 59

## 2014-06-30 ENCOUNTER — Other Ambulatory Visit (INDEPENDENT_AMBULATORY_CARE_PROVIDER_SITE_OTHER): Payer: 59 | Admitting: *Deleted

## 2014-06-30 DIAGNOSIS — E782 Mixed hyperlipidemia: Secondary | ICD-10-CM

## 2014-06-30 DIAGNOSIS — Z79899 Other long term (current) drug therapy: Secondary | ICD-10-CM

## 2014-06-30 LAB — HEPATIC FUNCTION PANEL
ALT: 53 U/L — ABNORMAL HIGH (ref 0–35)
AST: 65 U/L — ABNORMAL HIGH (ref 0–37)
Albumin: 3.8 g/dL (ref 3.5–5.2)
Alkaline Phosphatase: 44 U/L (ref 39–117)
Bilirubin, Direct: 0 mg/dL (ref 0.0–0.3)
Total Bilirubin: 0.5 mg/dL (ref 0.2–1.2)
Total Protein: 8 g/dL (ref 6.0–8.3)

## 2014-07-03 LAB — NMR LIPOPROFILE WITH LIPIDS
Cholesterol, Total: 167 mg/dL (ref 100–199)
HDL Particle Number: 12.6 umol/L — ABNORMAL LOW (ref >=30.5)
HDL Size: 8.7 nm — ABNORMAL LOW (ref >=9.2)
HDL-C: 15 mg/dL — ABNORMAL LOW (ref >39)
LDL (calc): 97 mg/dL (ref 0–99)
LDL Particle Number: 2379 nmol/L — ABNORMAL HIGH (ref <1000)
LDL Size: 19.8 nm (ref >=20.8)
LP-IR Score: 69 — ABNORMAL HIGH (ref <=45)
Large HDL-P: <1.3 umol/L — ABNORMAL LOW (ref >=4.8)
Large VLDL-P: 2.6 nmol/L (ref <=2.7)
Small LDL Particle Number: 1960 nmol/L — ABNORMAL HIGH (ref <=527)
Triglycerides: 276 mg/dL — ABNORMAL HIGH (ref 0–149)
VLDL Size: 47.1 nm — ABNORMAL HIGH (ref <=46.6)

## 2014-07-04 ENCOUNTER — Ambulatory Visit: Payer: 59 | Admitting: Pharmacist

## 2014-07-04 ENCOUNTER — Ambulatory Visit (INDEPENDENT_AMBULATORY_CARE_PROVIDER_SITE_OTHER): Payer: 59 | Admitting: Pharmacist

## 2014-07-04 DIAGNOSIS — E782 Mixed hyperlipidemia: Secondary | ICD-10-CM

## 2014-07-04 NOTE — Progress Notes (Signed)
Patient is a pleasant 58 y.o. WF who is was referred to Dr. Anne FuSkains and lipid clinic by Dr. Merri Brunetteandace Smith a few years ago.  She is here for follow-up.  She has a h/o TG > 1800 mg/dL which improved after stopping oral estrogen, reducing alcohol, and starting fish oil and fenofibrate.  She has an elevated LDL-P number, which was > 16103000 nmol/L late last year.  She has failed multiple statins.   patient has reduced her alcohol significantly (~3 nights per week), and has cut down on cigarettes to ~ 2-3 per day.   Both her father and aunt have a h/o AAA, but patient was screened for this and it was negative.  She was asked to increase her Livalo to 4mg  daily at her last visit but had to decrease back to 2 mg qd due to muscle pains.  She continues fenofibrate + fish oil well today, and LDL-P number has remained stable.  LDL-P number is still > 2000 nmol/L and would like to get to < 1300.  RF:  Tobacco use, HDL, low HDL - LDL goal < 960130, LDL-P number goal < 1300 Meds:  Fenofibrate 160 mg with meal, fish oil 6 g/d, Livalo 2 mg qd Intolerant:  Crestor, Lipitor (both caused muscle aches), Livalo 4mg    Diet:  She has been eating a low fat diet, and over past few months has cut her wine intake down from 7-10 glasses per week, down to virtually none.   Exercise:  She is walking more for exercise now since her knee surgery last year (10/2012) Smoking:  She is smoking 2-3 cigarettes per day.  Failed Wellbutrin in the past due to insomnia.  Reluctant to use Chanitx.    Labs: 06/2014: LDL-P number 2379, TC 167, LDL 97, TG 276, HDL 15.  LFTs are slightly elevated.  This is different from 6 months ago but similar to 5 years ago (no records available in between) 12/2013:  LDL-P number 2392, TC 183, LDL 106, TG 264, HDL 24 (Fenofibrate 160 mg qd, fish oil 6 g/d, Livalo 2 mg qd)

## 2014-07-04 NOTE — Patient Instructions (Signed)
Continue your current therapy.   Try to exercise at least 10-20 minutes 2-3 days per week.   I will call you in February to make an appt with a cardiologist.

## 2014-07-05 NOTE — Assessment & Plan Note (Signed)
Pt's cholesterol remained stable on Livalo 2mg  daily, fenofibrate and fish oil.  Her LDL is at goal but LDL-P still elevated.  It has trended down from baseline.  At this time, she is likely on optimal medication therapy, but needs to concentrate more on lifestyle improvements.  Discussed exercise and dietary changes today.   Pt's LFTs are elevated.  Based on past records, this is not the first time this has happened.  Will send to PCP for management.  Plan to recheck labs in 6 months.  Pt has not been seen by cardiologist in >12 months.  At her next visit, she will need to be seen by cardiologist in order to remain in the lipid clinic.

## 2014-08-31 ENCOUNTER — Other Ambulatory Visit: Payer: Self-pay | Admitting: Cardiology

## 2014-12-06 DIAGNOSIS — IMO0002 Reserved for concepts with insufficient information to code with codable children: Secondary | ICD-10-CM | POA: Insufficient documentation

## 2015-03-19 ENCOUNTER — Other Ambulatory Visit: Payer: Self-pay | Admitting: Obstetrics & Gynecology

## 2015-03-20 LAB — CYTOLOGY - PAP

## 2015-04-27 ENCOUNTER — Telehealth: Payer: Self-pay | Admitting: Pharmacist

## 2015-04-27 MED ORDER — FENOFIBRATE 160 MG PO TABS: 160.0000 mg | ORAL_TABLET | Freq: Every day | ORAL | Status: DC

## 2015-04-27 NOTE — Telephone Encounter (Signed)
Pt called to follow up on her cholesterol medications.  She needs a refill on her fenofibrate but also wanted to report side effects with Livalo.  She states she started having muscle pains in her legs and is concerned it may be causing some cognitive delays, increase in blood sugar as well as cataracts.  She stopped taking it ~ 1 week ago and her muscle pains are improving.   Pt has not seen Dr. Anne Fu in > 2 years.  Discussed at her last visit, she would need to see Cardiologist next time.  Make an appt with next available slot on 9/22.  Will get fasting labs at that time.  Will refill fenofibrate once until she can get in for the appointment.

## 2015-06-14 ENCOUNTER — Encounter: Payer: Self-pay | Admitting: Cardiology

## 2015-06-14 ENCOUNTER — Ambulatory Visit (INDEPENDENT_AMBULATORY_CARE_PROVIDER_SITE_OTHER): Payer: 59 | Admitting: Cardiology

## 2015-06-14 VITALS — BP 122/60 | HR 54 | Ht 66.0 in | Wt 149.0 lb

## 2015-06-14 DIAGNOSIS — M791 Myalgia, unspecified site: Secondary | ICD-10-CM | POA: Insufficient documentation

## 2015-06-14 DIAGNOSIS — E119 Type 2 diabetes mellitus without complications: Secondary | ICD-10-CM | POA: Diagnosis not present

## 2015-06-14 DIAGNOSIS — E782 Mixed hyperlipidemia: Secondary | ICD-10-CM

## 2015-06-14 DIAGNOSIS — R7309 Other abnormal glucose: Secondary | ICD-10-CM | POA: Insufficient documentation

## 2015-06-14 LAB — HEMOGLOBIN A1C: Hgb A1c MFr Bld: 5.9 % (ref 4.6–6.5)

## 2015-06-14 LAB — CK: Total CK: 178 U/L — ABNORMAL HIGH (ref 7–177)

## 2015-06-14 NOTE — Progress Notes (Signed)
Cardiology Office Note   Date:  06/14/2015   ID:  Darlene Saunders, DOB 07-30-1956, MRN [id number]  PCP:  Darlene Found, MD  Cardiologist:   Darlene Schultz, MD       History of Present Illness: Darlene Saunders is a 59 y.o. female here for evaluation of hyperlipidemia. She was last seen by me on 18/2012. She is a history of back pain, chronic medications, migraines previously treated at headache center. Triglycerides at one point were 305, LDL 134, HDL 21. Baseline triglycerides have been as high as the 1900 range.  She used to see Darlene Saunders had [place] [facility] years ago. 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 No prior evidence of pancreatitis. 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  In her 40's started having cataracts. She was taking the statins at the time  She denies any exertional anginal symptoms, no syncope, no bleeding, no orthopnea, no PND.  She's had quite a few statin intolerances. She also continues to smoke.  She does drink alcohol, her AST has been elevated in the past. Only drinking wine on weekends.   She lost 15 pounds after hemoglobin A1c was 6.6 and 02/16/2015.. Right leg pain on Darlene Saunders.     Past Medical History  Diagnosis Date  . Hyperlipidemia   . Hypertension   . Complete hearing loss of right ear   . Esophageal reflux   . Migraine     Past Surgical History  Procedure Laterality Date  . Appendectomy    . Hysterectomy, removal of right ovary    . Laparotomy       Current Outpatient Prescriptions  Medication Sig Dispense Refill  . ALPRAZolam (XANAX) 0.5 MG tablet     . atenolol (TENORMIN) 100 MG tablet     . Biotin 5000 MCG CAPS Take 1 capsule by mouth daily.    . cyclobenzaprine (FLEXERIL) 10 MG tablet     . esomeprazole (NEXIUM) 40 MG capsule Take 40 mg by mouth daily at 12 noon.    . fenofibrate 160 MG tablet Take 1 tablet (160 mg total) by mouth daily. 90 tablet 0  . fexofenadine (ALLEGRA) 180 MG  tablet Take 180 mg by mouth as needed for allergies or rhinitis.    . indapamide (LOZOL) 2.5 MG tablet     . levothyroxine (SYNTHROID, LEVOTHROID) 50 MCG tablet     . metoCLOPramide (REGLAN) 10 MG tablet Take 10 mg by mouth as needed for nausea (Take one tablet every 4 to 6 hours with migraine headaches as directed for her migrains).    . Omega-3 Fatty Acids (FISH OIL) 1000 MG CAPS Take 3 capsules by mouth 2 (two) times daily.    . rizatriptan (MAXALT-MLT) 10 MG disintegrating tablet Take 10 mg by mouth as needed for migraine (Take 1 pill at headache onset and 1 pill two hours later if any headache remains. Maximum 2 pills in twenty-four hours as directed). May repeat in 2 hours if needed    . sertraline (ZOLOFT) 100 MG tablet     . valACYclovir (VALTREX) 1000 MG tablet TK 1 T PO BID  3  . VIVELLE-DOT 0.1 MG/24HR patch      No current facility-administered medications for this visit.    Allergies:   Dexilant; Crestor; Lipitor; Sulfamethoxazole-trimethoprim; Augmentin; and Cipro hc    Social History:  The patient  reports that she has been smoking Cigarettes.  She has a 7.5 pack-year smoking history. She  does not have any smokeless tobacco history on file. She reports that she drinks alcohol. She reports that she does not use illicit drugs.   Family History:  The patient's family history includes Cancer - Other in her maternal grandmother and paternal grandmother; Cancer - Prostate in her father; Dementia in her mother; Hyperlipidemia in her father; Hypertension in her father, mother, and paternal grandfather; Thyroid disease in her mother.    ROS:  Please see the history of present illness.   Otherwise, review of Saunders are positive for none.   All other Saunders are reviewed and negative.    PHYSICAL EXAM: VS:  BP 122/60 mmHg  Pulse 54  Ht  (1.676 m)  Wt 149 lb (67.586 kg)  BMI 24.06 kg/m2 , BMI Body mass index is 24.06 kg/(m^2). GEN: Well nourished, well developed, in no acute  distress HEENT: normal Neck: no JVD, carotid bruits, or masses Cardiac: RRR; no murmurs, rubs, or gallops,no edema  Respiratory:  clear to auscultation bilaterally, normal work of breathing GI: soft, nontender, nondistended, + BS MS: no deformity or atrophy Skin: warm and dry, no rash Neuro:  Strength and sensation are intact Psych: euthymic mood, full affect   EKG:  EKG is ordered today. The ekg ordered today demonstrates 06/14/15-sinus bradycardia rate 56 with no other abnormalities.   Recent Labs: 06/30/2014: ALT 53*    Lipid Panel    Component Value Date/Time   CHOL 167 06/30/2014 0849   CHOL 144 02/02/2009 0821   TRIG 276* 06/30/2014 0849   TRIG 270.0* 02/02/2009 0821   TRIG 923* 10/05/2006 0825   HDL 15* 06/30/2014 0849   HDL 21.10* 02/02/2009 0821   CHOLHDL 7 02/02/2009 0821   CHOLHDL 8.2 CALC 10/05/2006 0825   VLDL 54.0* 02/02/2009 0821   LDLCALC 97 06/30/2014 0849   LDLDIRECT 91.2 02/02/2009 0821   LDLDIRECT 94.4 10/05/2006 0825      Wt Readings from Last 3 Encounters:  06/14/15 149 lb (67.586 kg)  01/10/14 158 lb (71.668 kg)  02/22/09 156 lb (70.761 kg)      Other studies Reviewed: Additional studies/ records that were reviewed today include: Prior lab work, EKG, office notes. Review of the above records demonstrates: As above   ASSESSMENT AND PLAN:  1.  Hyperlipidemia-she has been seen in the lipid clinic, previously by Darlene Saunders and currently by Darlene Saunders and colleagues. Has had side effects with Darlene Saunders. Muscle pains in legs, concerned about cognitive delays, increased blood sugar, cataract. She stopped taking it in late July. Muscle pain seemed to improve. Also on fenofibrate. She has been tolerating this well. Baseline triglycerides have been extremely elevated. Unfortunately, she had a hemoglobin A1c on 02/16/15 of 6.6 which places her with diabetes. We would love for her to be on statin and the situation. I offered her pravastatin which is often  well-tolerated. She would like to wait and discuss this further with Darlene Saunders. This is very reasonable. Also, she has lost 15 pounds. Perhaps her hemoglobin A1c is decreased as well. I will recheck for her and send the results to Dr. Merri Brunette. I will check CK as well given her prior muscle pain.  2. Diabetes-hemoglobin A1c 6.6 as above. Rechecking at her request after 15 pound weight loss.   Current medicines are reviewed at length with the patient today.  The patient has concerns regarding medicines.  The following changes have been made:  no change  Labs/ tests ordered today include: as above No orders  of the defined types were placed in this encounter.     Disposition:   She will follow-up with me in one year. She will follow-up with Sally in the next several weeks Kennon Rounds the lipid clinic.   Mathews Robinsons, MD  06/14/2015 9:07 AM    [facility] [address] [place], [place], [place]  [address] Phone: [phone]; Fax: [phone]

## 2015-06-14 NOTE — Patient Instructions (Signed)
Medication Instructions:  Your physician recommends that you continue on your current medications as directed. Please refer to the Current Medication list given to you today.  Labwork: CK, A1C, NMR   Testing/Procedures: NONE  Follow-Up: Your physician wants you to follow-up in: 1 YEAR  You will receive a reminder letter in the mail two months in advance. If you don't receive a letter, please call our office to schedule the follow-up appointment.  RETURN IN THE NEXT COUPLE OF WEEKS TO SEE SALLY PHARM D IN LIPID CLINC

## 2015-06-16 LAB — NMR LIPOPROFILE WITH LIPIDS
Cholesterol, Total: 239 mg/dL — ABNORMAL HIGH (ref 100–199)
HDL Particle Number: 12.8 umol/L — ABNORMAL LOW (ref >=30.5)
HDL Size: 8.3 nm — ABNORMAL LOW (ref >=9.2)
HDL-C: 18 mg/dL — ABNORMAL LOW (ref >39)
LDL (calc): 160 mg/dL — ABNORMAL HIGH (ref 0–99)
LDL Particle Number: 3124 nmol/L — ABNORMAL HIGH (ref <1000)
LDL Size: 20.3 nm (ref >=20.8)
LP-IR Score: 47 — ABNORMAL HIGH (ref <=45)
Large HDL-P: <1.3 umol/L — ABNORMAL LOW (ref >=4.8)
Large VLDL-P: <0.8 nmol/L (ref <=2.7)
Small LDL Particle Number: 2272 nmol/L — ABNORMAL HIGH (ref <=527)
Triglycerides: 304 mg/dL — ABNORMAL HIGH (ref 0–149)
VLDL Size: 40.8 nm (ref <=46.6)

## 2015-06-21 ENCOUNTER — Telehealth: Payer: Self-pay | Admitting: Cardiology

## 2015-06-21 ENCOUNTER — Other Ambulatory Visit: Payer: Self-pay | Admitting: *Deleted

## 2015-06-21 MED ORDER — PRAVASTATIN SODIUM 40 MG PO TABS: 40.0000 mg | ORAL_TABLET | Freq: Every evening | ORAL | Status: DC

## 2015-06-21 NOTE — Telephone Encounter (Signed)
Reviewed results with pt who stated understanding.  rx sent into pharmacy as requested.  She will keep her appt as scheduled in the lipid clinic.

## 2015-06-21 NOTE — Telephone Encounter (Signed)
F/u   Pt returning Pam/s phone call concerning lab work. Please call back and discuss.

## 2015-06-29 ENCOUNTER — Ambulatory Visit (INDEPENDENT_AMBULATORY_CARE_PROVIDER_SITE_OTHER): Payer: 59 | Admitting: Pharmacist

## 2015-06-29 DIAGNOSIS — E782 Mixed hyperlipidemia: Secondary | ICD-10-CM | POA: Diagnosis not present

## 2015-06-29 NOTE — Progress Notes (Signed)
Patient is a pleasant 59 y.o. WF who is was referred to Dr. Anne Fu and lipid clinic by Dr. Merri Brunette a few years ago.  She is here for follow-up.  She has a h/o TG > 1800 mg/dL which improved after stopping oral estrogen, reducing alcohol, and starting fish oil and fenofibrate.  She has an elevated LDL-P number, which is > 3000 nmol/L when off statins.  She has failed multiple statins including Crestor, Lipitor and Livalo.  She called the office in August to report muscle aches with Livalo and stopped at that time.  She has continued on the fenofibrate with no issues.  She has some concerns with restarting statin as she is worried they lead to the increase in her A1c and her cataracts.  Pt states her A1c was elevated with her PCP earlier this summer so she worked on decreasing her carbs and wine and lost 14 lbs.  Her most recent A1c was normal at 5.9.  Pt has no history of CAD.   Both her father and aunt have a h/o AAA, but patient was screened for this and it was negative.    RF:  Tobacco use, HDL, low HDL - LDL goal < 161, LDL-P number goal < 1300 Meds:  Fenofibrate 160 mg with meal, fish oil 6 g/d,  Intolerant:  Crestor, Lipitor (both caused muscle aches), Livalo  and    Diet:  She has been eating a low fat diet, and over past few months has cut her wine intake down from 7-10 glasses per week, down to virtually none.   Exercise:  She does not have a regular exercise routine Smoking: Continues to smoke Failed Wellbutrin in the past due to insomnia.  Reluctant to use Chanitx.    Labs: 05/2015: LDL- P number 3124, TC 239, LDL 160, TG 304, HDL 18, CK 178 (Fenofibrate ) 06/2014: LDL-P number 2379, TC 167, LDL 97, TG 276, HDL 15.  LFTs are slightly elevated.  This is different from 6 months ago but similar to 5 years ago (no records available in between) 12/2013:  LDL-P number 2392, TC 183, LDL 106, TG 264, HDL 24 (Fenofibrate 160 mg qd, fish oil 6 g/d, Livalo 2 mg qd)  Current  Outpatient Prescriptions  Medication Sig Dispense Refill  . ALPRAZolam (XANAX) 0.5 MG tablet     . atenolol (TENORMIN) 100 MG tablet     . Biotin 5000 MCG CAPS Take 1 capsule by mouth daily.    . cyclobenzaprine (FLEXERIL) 10 MG tablet     . esomeprazole (NEXIUM) 40 MG capsule Take 40 mg by mouth daily at 12 noon.    . fenofibrate 160 MG tablet Take 1 tablet (160 mg total) by mouth daily. 90 tablet 0  . fexofenadine (ALLEGRA) 180 MG tablet Take 180 mg by mouth as needed for allergies or rhinitis.    . indapamide (LOZOL) 2.5 MG tablet     . levothyroxine (SYNTHROID, LEVOTHROID) 50 MCG tablet     . metoCLOPramide (REGLAN) 10 MG tablet Take 10 mg by mouth as needed for nausea (Take one tablet every 4 to 6 hours with migraine headaches as directed for her migrains).    . Omega-3 Fatty Acids (FISH OIL) 1000 MG CAPS Take 3 capsules by mouth 2 (two) times daily.    . pravastatin (PRAVACHOL) 40 MG tablet Take 1 tablet (40 mg total) by mouth every evening. 30 tablet 6  . rizatriptan (MAXALT-MLT) 10 MG disintegrating tablet Take 10 mg  by mouth as needed for migraine (Take 1 pill at headache onset and 1 pill two hours later if any headache remains. Maximum 2 pills in twenty-four hours as directed). May repeat in 2 hours if needed    . sertraline (ZOLOFT) 100 MG tablet     . valACYclovir (VALTREX) 1000 MG tablet TK 1 T PO BID  3  . VIVELLE-DOT 0.1 MG/24HR patch      No current facility-administered medications for this visit.   Allergies  Allergen Reactions  . Dexilant [Dexlansoprazole] Diarrhea  . Crestor [Rosuvastatin]     Muscle aches  . Lipitor [Atorvastatin]     Muscle aches  . Sulfamethoxazole-Trimethoprim   . Augmentin [Amoxicillin-Pot Clavulanate] Hives  . Cipro Hc [Ciprofloxacin-Hydrocortisone]     Topical reaction   Assessment and Plan 1.  Hyperlipidemia-  Pt's LDL and LDL-P significantly elevated with patient off statin.  She was given an Rx for pravastatin  daily a few weeks ago  but has been hesitant to start.  We discussed the primary prevention data related to statins and her concerns related to diabetes and cataracts.  Explained that the prevention of cardiac disease far outweighs the potential for side effects with statins.  She does not have a strong family history of heart disease, but given her discordance and that she is a current smoker, she is at high risk.  Discussed some non-invasive screening tests such as calcium scoring with patient as well if that would help her feel more comfortable taking a statin.  She will think about it.  In the interim, she will pick up the prescription for pravastatin and see how she does.  She will call us in a few weeks with an update.

## 2015-07-09 ENCOUNTER — Other Ambulatory Visit: Payer: Self-pay | Admitting: Cardiology

## 2016-02-24 ENCOUNTER — Other Ambulatory Visit: Payer: Self-pay | Admitting: Cardiology

## 2016-04-05 ENCOUNTER — Other Ambulatory Visit: Payer: Self-pay | Admitting: Cardiology

## 2016-06-16 ENCOUNTER — Ambulatory Visit (INDEPENDENT_AMBULATORY_CARE_PROVIDER_SITE_OTHER): Payer: 59 | Admitting: Cardiology

## 2016-06-16 ENCOUNTER — Encounter: Payer: Self-pay | Admitting: Cardiology

## 2016-06-16 VITALS — BP 118/62 | HR 62 | Ht 66.0 in | Wt 159.2 lb

## 2016-06-16 DIAGNOSIS — E782 Mixed hyperlipidemia: Secondary | ICD-10-CM

## 2016-06-16 DIAGNOSIS — Z79899 Other long term (current) drug therapy: Secondary | ICD-10-CM

## 2016-06-16 DIAGNOSIS — M791 Myalgia, unspecified site: Secondary | ICD-10-CM

## 2016-06-16 DIAGNOSIS — E119 Type 2 diabetes mellitus without complications: Secondary | ICD-10-CM

## 2016-06-16 LAB — ALT: ALT: 30 U/L — ABNORMAL HIGH (ref 6–29)

## 2016-06-16 LAB — LIPID PANEL
Cholesterol: 211 mg/dL — ABNORMAL HIGH (ref 125–200)
HDL: 14 mg/dL — ABNORMAL LOW (ref >=46)
LDL Cholesterol: 129 mg/dL (ref <130)
Total CHOL/HDL Ratio: 15.1 Ratio — ABNORMAL HIGH (ref <=5.0)
Triglycerides: 339 mg/dL — ABNORMAL HIGH (ref <150)
VLDL: 68 mg/dL — ABNORMAL HIGH (ref <30)

## 2016-06-16 NOTE — Patient Instructions (Signed)
Medication Instructions:  The current medical regimen is effective;  continue present plan and medications.  Labwork: Please have a Lipid panel and ALT today.  Follow-Up: Follow up in 1 year with Dr. Anne FuSkains.  You will receive a letter in the mail 2 months before you are due.  Please call us when you receive this letter to schedule your follow up appointment.  If you need a refill on your cardiac medications before your next appointment, please call your pharmacy.  Thank you for choosing [facility]!!

## 2016-06-16 NOTE — Progress Notes (Signed)
Cardiology Office Note   Date:  06/16/2016   ID:  Darlene Saunders, DOB [birth year], MRN [id number]  PCP:  Allean Found, MD  Cardiologist:   Donato Schultz, MD       History of Present Illness: Darlene Saunders is a 60 y.o. female here for evaluation of hyperlipidemia. She was last seen by me on 18/2012. She is a history of back pain, chronic medications, migraines previously treated at headache center. Triglycerides at one point were 305, LDL 134, HDL 21. Baseline triglycerides have been as high as the 1900 range.  She used to see [name] had [place] [facility] years ago. 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 No prior evidence of pancreatitis. 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  In her 40's started having cataracts. She was taking the statins at the time  She denies any exertional anginal symptoms, no syncope, no bleeding, no orthopnea, no PND.  She's had quite a few statin intolerances. She also continues to smoke.  She does drink alcohol, her AST has been elevated in the past. Only drinking wine on weekends.   She lost 15 pounds after hemoglobin A1c was 6.6 and 02/16/2015.. Right leg pain on Livalo.     Past Medical History:  Diagnosis Date  . Complete hearing loss of right ear   . Esophageal reflux   . Hyperlipidemia   . Hypertension   . Migraine     Past Surgical History:  Procedure Laterality Date  . APPENDECTOMY    . hysterectomy, removal of right ovary    . LAPAROTOMY       Current Outpatient Prescriptions  Medication Sig Dispense Refill  . ALPRAZolam (XANAX) 0.5 MG tablet Take 0.5 mg by mouth at bedtime.     Marland Kitchen atenolol (TENORMIN) 100 MG tablet Take 100 mg by mouth daily.     . Biotin 5000 MCG CAPS Take 1 capsule by mouth daily.    . cyclobenzaprine (FLEXERIL) 10 MG tablet Take 10 mg by mouth at bedtime.     Marland Kitchen esomeprazole (NEXIUM) 40 MG capsule Take 40 mg by mouth daily at 12 noon.    . fenofibrate 160 MG tablet  Take 1 tablet by mouth  daily 90 tablet 0  . fexofenadine (ALLEGRA) 180 MG tablet Take 180 mg by mouth as needed for allergies or rhinitis.    . indapamide (LOZOL) 2.5 MG tablet Take 2.5 mg by mouth daily.     Marland Kitchen levothyroxine (SYNTHROID, LEVOTHROID) 50 MCG tablet Take 50 mcg by mouth daily before breakfast.     . metoCLOPramide (REGLAN) 10 MG tablet Take 10 mg by mouth as needed for nausea (Take one tablet every 4 to 6 hours with migraine headaches as directed for her migrains).    . Omega-3 Fatty Acids (FISH OIL) 1000 MG CAPS Take 3 capsules by mouth 2 (two) times daily.    . pravastatin (PRAVACHOL) 40 MG tablet TAKE 1 TABLET(40 MG) BY MOUTH EVERY EVENING 30 tablet 3  . rizatriptan (MAXALT-MLT) 10 MG disintegrating tablet Take 10 mg by mouth as needed for migraine (Take 1 pill at headache onset and 1 pill two hours later if any headache remains. Maximum 2 pills in twenty-four hours as directed). May repeat in 2 hours if needed    . sertraline (ZOLOFT) 100 MG tablet Take 100 mg by mouth daily.     . valACYclovir (VALTREX) 1000 MG tablet Take one tablet by mouth twice a day  3  . VIVELLE-DOT 0.1 MG/24HR patch Place 1 patch onto the skin 2 (two) times a week.      No current facility-administered medications for this visit.     Allergies:   Dexilant [dexlansoprazole]; Crestor [rosuvastatin]; Lipitor [atorvastatin]; Sulfamethoxazole-trimethoprim; Augmentin [amoxicillin-pot clavulanate]; and Cipro hc [ciprofloxacin-hydrocortisone]    Social History:  The patient  reports that she has been smoking Cigarettes.  She has a 7.50 pack-year smoking history. She does not have any smokeless tobacco history on file. She reports that she drinks alcohol. She reports that she does not use drugs.   Family History:  The patient's family history includes Cancer - Other in her maternal grandmother and paternal grandmother; Cancer - Prostate in her father; Dementia in her mother; Hyperlipidemia in her father;  Hypertension in her father, mother, and paternal grandfather; Thyroid disease in her mother.    ROS:  Please see the history of present illness.   Otherwise, review of systems are positive for none.   All other systems are reviewed and negative.    PHYSICAL EXAM: VS:  BP 118/62   Pulse 62   Ht 5\' 6"  (1.676 m)   Wt 159 lb 3.2 oz (72.2 kg)   BMI 25.70 kg/m  , BMI Body mass index is 25.7 kg/m. GEN: Well nourished, well developed, in no acute distress HEENT: normal Neck: no JVD, carotid bruits, or masses Cardiac: RRR; no murmurs, rubs, or gallops,no edema  Respiratory:  clear to auscultation bilaterally, normal work of breathing GI: soft, nontender, nondistended, + BS MS: no deformity or atrophy Skin: warm and dry, no rash Neuro:  Strength and sensation are intact Psych: euthymic mood, full affect   EKG:  EKG is ordered today. The ekg ordered today demonstrates 06/16/16-sinus rhythm, 62 no other significant abnormality is. 06/14/15-sinus bradycardia rate 56 with no other abnormalities.   Recent Labs: No results found for requested labs within last 8760 hours.    Lipid Panel    Component Value Date/Time   CHOL 239 (H) 06/14/2015 0936   TRIG 304 (H) 06/14/2015 0936   TRIG 923 (HH) 10/05/2006 0825   HDL 18 (L) 06/14/2015 0936   CHOLHDL 7 02/02/2009 0821   VLDL 54.0 (H) 02/02/2009 0821   LDLCALC 160 (H) 06/14/2015 0936   LDLDIRECT 91.2 02/02/2009 0821      Wt Readings from Last 3 Encounters:  06/16/16 159 lb 3.2 oz (72.2 kg)  06/14/15 149 lb (67.6 kg)  01/10/14 158 lb (71.7 kg)      Other studies Reviewed: Additional studies/ records that were reviewed today include: Prior lab work, EKG, office notes. Review of the above records demonstrates: As above  Labs: 05/2015: LDL- P number 3124, TC 239, LDL 160, TG 304, HDL 18, CK 178 (Fenofibrate 160mg ) 06/2014: LDL-P number 2379, TC 167, LDL 97, TG 276, HDL 15.  LFTs are slightly elevated.  This is different from 6 months  ago but similar to 5 years ago (no records available in between) 12/2013:  LDL-P number 2392, TC 183, LDL 106, TG 264, HDL 24 (Fenofibrate 160 mg qd, fish oil 6 g/d, Livalo 2 mg qd)  CK - normal   ASSESSMENT AND PLAN:  1.  Hyperlipidemia-she [facility] by Audrie L[facility]Sally Earl and colleagues. Has had side effects with Livalo. Muscle pains in legs, concerned about cognitive delays, increased blood sugar, cataract. She stopped taking it in late July. Muscle pain seemed to improve. Also on fenofibrate.  She has been tolerating this well. Baseline triglycerides have been extremely elevated. Unfortunately, she had a hemoglobin A1c on 02/16/15 of 6.6 which places her with diabetes. We would love for her to be on statin and the situation. Also, she has lost 15 pounds. Perhaps her hemoglobin A1c is decreased as well. I will recheck for her and send the results to Dr. Merri Brunette. Doing well with pravastatin. No complaints. No myalgias. Checking blood work. 2. Diabetes-hemoglobin A1c 6.6 as above. Rechecking at her request after 15 pound weight loss.   Current medicines are reviewed at length with the patient today.  The patient has concerns regarding medicines.  The following changes have been made:  no change  Labs/ tests ordered today include: as above  Orders Placed This Encounter  Procedures  . ALT  . Lipid panel  . EKG 12-Lead     Disposition:   She will follow-up with me in one year.   Signed, Donato Schultz, MD  06/16/2016 8:49 AM    [facility] [address] [place], [place], [place]  [address] Phone: [phone]; Fax: [phone]

## 2016-06-28 ENCOUNTER — Other Ambulatory Visit: Payer: Self-pay | Admitting: Cardiology

## 2016-07-02 ENCOUNTER — Telehealth: Payer: Self-pay | Admitting: Cardiology

## 2016-07-02 NOTE — Telephone Encounter (Signed)
Follow Up:    Pt said she left you a message last week and was waiting to hear from you.

## 2016-07-02 NOTE — Telephone Encounter (Signed)
I spoke to patient. She states she does not know why Pam was calling her. I advised her the only reason that I could find is lab results (which have been released to MyChart).  I reviewed results and recommendations with pt. She also asked about EKG results from 06/16/2016 OV. I advised Dr. Judd GaudierSkain's note states the EKG was WNL.  She voiced understanding and thanks.

## 2016-07-10 ENCOUNTER — Other Ambulatory Visit: Payer: Self-pay | Admitting: Cardiology

## 2016-07-21 ENCOUNTER — Other Ambulatory Visit: Payer: Self-pay | Admitting: Cardiology

## 2016-07-21 MED ORDER — PRAVASTATIN SODIUM 40 MG PO TABS: ORAL_TABLET | ORAL | 3 refills | Status: DC

## 2016-10-02 DIAGNOSIS — Z23 Encounter for immunization: Secondary | ICD-10-CM | POA: Diagnosis not present

## 2016-10-14 DIAGNOSIS — L719 Rosacea, unspecified: Secondary | ICD-10-CM | POA: Diagnosis not present

## 2016-12-02 DIAGNOSIS — E039 Hypothyroidism, unspecified: Secondary | ICD-10-CM | POA: Diagnosis not present

## 2016-12-02 DIAGNOSIS — I10 Essential (primary) hypertension: Secondary | ICD-10-CM | POA: Diagnosis not present

## 2016-12-02 DIAGNOSIS — E782 Mixed hyperlipidemia: Secondary | ICD-10-CM | POA: Diagnosis not present

## 2017-01-09 DIAGNOSIS — H26493 Other secondary cataract, bilateral: Secondary | ICD-10-CM | POA: Diagnosis not present

## 2017-01-29 DIAGNOSIS — H26492 Other secondary cataract, left eye: Secondary | ICD-10-CM | POA: Diagnosis not present

## 2017-02-26 DIAGNOSIS — J0100 Acute maxillary sinusitis, unspecified: Secondary | ICD-10-CM | POA: Diagnosis not present

## 2017-03-12 DIAGNOSIS — H26491 Other secondary cataract, right eye: Secondary | ICD-10-CM | POA: Diagnosis not present

## 2017-04-07 DIAGNOSIS — R319 Hematuria, unspecified: Secondary | ICD-10-CM | POA: Diagnosis not present

## 2017-04-07 DIAGNOSIS — N3001 Acute cystitis with hematuria: Secondary | ICD-10-CM | POA: Diagnosis not present

## 2017-04-14 DIAGNOSIS — I83893 Varicose veins of bilateral lower extremities with other complications: Secondary | ICD-10-CM | POA: Diagnosis not present

## 2017-04-14 DIAGNOSIS — I8312 Varicose veins of left lower extremity with inflammation: Secondary | ICD-10-CM | POA: Diagnosis not present

## 2017-04-14 DIAGNOSIS — I8311 Varicose veins of right lower extremity with inflammation: Secondary | ICD-10-CM | POA: Diagnosis not present

## 2017-04-15 DIAGNOSIS — I8312 Varicose veins of left lower extremity with inflammation: Secondary | ICD-10-CM | POA: Diagnosis not present

## 2017-04-15 DIAGNOSIS — I8311 Varicose veins of right lower extremity with inflammation: Secondary | ICD-10-CM | POA: Diagnosis not present

## 2017-05-14 DIAGNOSIS — I83893 Varicose veins of bilateral lower extremities with other complications: Secondary | ICD-10-CM | POA: Diagnosis not present

## 2017-05-14 DIAGNOSIS — I8312 Varicose veins of left lower extremity with inflammation: Secondary | ICD-10-CM | POA: Diagnosis not present

## 2017-05-14 DIAGNOSIS — I8311 Varicose veins of right lower extremity with inflammation: Secondary | ICD-10-CM | POA: Diagnosis not present

## 2017-05-17 ENCOUNTER — Other Ambulatory Visit: Payer: Self-pay | Admitting: Cardiology

## 2017-06-26 DIAGNOSIS — I83892 Varicose veins of left lower extremities with other complications: Secondary | ICD-10-CM | POA: Diagnosis not present

## 2017-06-26 DIAGNOSIS — I8312 Varicose veins of left lower extremity with inflammation: Secondary | ICD-10-CM | POA: Diagnosis not present

## 2017-06-29 DIAGNOSIS — I8312 Varicose veins of left lower extremity with inflammation: Secondary | ICD-10-CM | POA: Diagnosis not present

## 2017-07-03 DIAGNOSIS — I83892 Varicose veins of left lower extremities with other complications: Secondary | ICD-10-CM | POA: Diagnosis not present

## 2017-07-03 DIAGNOSIS — I8311 Varicose veins of right lower extremity with inflammation: Secondary | ICD-10-CM | POA: Diagnosis not present

## 2017-07-03 DIAGNOSIS — I8312 Varicose veins of left lower extremity with inflammation: Secondary | ICD-10-CM | POA: Diagnosis not present

## 2017-07-06 DIAGNOSIS — I8312 Varicose veins of left lower extremity with inflammation: Secondary | ICD-10-CM | POA: Diagnosis not present

## 2017-07-09 ENCOUNTER — Other Ambulatory Visit: Payer: Self-pay | Admitting: Cardiology

## 2017-07-28 DIAGNOSIS — E039 Hypothyroidism, unspecified: Secondary | ICD-10-CM | POA: Diagnosis not present

## 2017-07-28 DIAGNOSIS — E119 Type 2 diabetes mellitus without complications: Secondary | ICD-10-CM | POA: Diagnosis not present

## 2017-07-28 DIAGNOSIS — Z23 Encounter for immunization: Secondary | ICD-10-CM | POA: Diagnosis not present

## 2017-07-28 DIAGNOSIS — E782 Mixed hyperlipidemia: Secondary | ICD-10-CM | POA: Diagnosis not present

## 2017-07-28 DIAGNOSIS — I10 Essential (primary) hypertension: Secondary | ICD-10-CM | POA: Diagnosis not present

## 2017-07-30 DIAGNOSIS — I83891 Varicose veins of right lower extremities with other complications: Secondary | ICD-10-CM | POA: Diagnosis not present

## 2017-07-30 DIAGNOSIS — I8311 Varicose veins of right lower extremity with inflammation: Secondary | ICD-10-CM | POA: Diagnosis not present

## 2017-08-12 DIAGNOSIS — R350 Frequency of micturition: Secondary | ICD-10-CM | POA: Diagnosis not present

## 2017-08-12 DIAGNOSIS — R399 Unspecified symptoms and signs involving the genitourinary system: Secondary | ICD-10-CM | POA: Diagnosis not present

## 2017-08-12 DIAGNOSIS — R300 Dysuria: Secondary | ICD-10-CM | POA: Diagnosis not present

## 2017-08-12 DIAGNOSIS — N3001 Acute cystitis with hematuria: Secondary | ICD-10-CM | POA: Diagnosis not present

## 2017-08-18 DIAGNOSIS — I8311 Varicose veins of right lower extremity with inflammation: Secondary | ICD-10-CM | POA: Diagnosis not present

## 2017-08-18 DIAGNOSIS — M7981 Nontraumatic hematoma of soft tissue: Secondary | ICD-10-CM | POA: Diagnosis not present

## 2017-08-18 DIAGNOSIS — I83891 Varicose veins of right lower extremities with other complications: Secondary | ICD-10-CM | POA: Diagnosis not present

## 2017-09-02 DIAGNOSIS — I8311 Varicose veins of right lower extremity with inflammation: Secondary | ICD-10-CM | POA: Diagnosis not present

## 2017-09-23 DIAGNOSIS — I8311 Varicose veins of right lower extremity with inflammation: Secondary | ICD-10-CM | POA: Diagnosis not present

## 2017-09-23 DIAGNOSIS — I83811 Varicose veins of right lower extremities with pain: Secondary | ICD-10-CM | POA: Diagnosis not present

## 2017-09-28 DIAGNOSIS — Z01419 Encounter for gynecological examination (general) (routine) without abnormal findings: Secondary | ICD-10-CM | POA: Diagnosis not present

## 2017-10-01 ENCOUNTER — Ambulatory Visit: Payer: 59 | Admitting: Cardiology

## 2017-10-01 ENCOUNTER — Other Ambulatory Visit: Payer: Self-pay | Admitting: Obstetrics & Gynecology

## 2017-10-01 ENCOUNTER — Encounter: Payer: Self-pay | Admitting: Cardiology

## 2017-10-01 VITALS — BP 130/88 | HR 61 | Ht 66.0 in | Wt 159.4 lb

## 2017-10-01 DIAGNOSIS — Z8249 Family history of ischemic heart disease and other diseases of the circulatory system: Secondary | ICD-10-CM | POA: Diagnosis not present

## 2017-10-01 DIAGNOSIS — E782 Mixed hyperlipidemia: Secondary | ICD-10-CM | POA: Diagnosis not present

## 2017-10-01 DIAGNOSIS — R109 Unspecified abdominal pain: Secondary | ICD-10-CM

## 2017-10-01 DIAGNOSIS — F17200 Nicotine dependence, unspecified, uncomplicated: Secondary | ICD-10-CM | POA: Diagnosis not present

## 2017-10-01 DIAGNOSIS — R928 Other abnormal and inconclusive findings on diagnostic imaging of breast: Secondary | ICD-10-CM

## 2017-10-01 MED ORDER — PRAVASTATIN SODIUM 40 MG PO TABS: 40.0000 mg | ORAL_TABLET | Freq: Every evening | ORAL | 3 refills | Status: DC

## 2017-10-01 MED ORDER — FENOFIBRATE 160 MG PO TABS: 160.0000 mg | ORAL_TABLET | Freq: Every day | ORAL | 3 refills | Status: DC

## 2017-10-01 NOTE — Progress Notes (Signed)
Cardiology Office Note   Date:  10/01/2017   ID:  Darlene Saunders, DOB Feb 25, 1956, MRN [id number]  PCP:  Merri Brunette, MD  Cardiologist:   Donato Schultz, MD       History of Present Illness: Darlene Saunders is a 62 y.o. female here for evaluation of hyperlipidemia. She was last seen by me on 18/2012. She is a history of back pain, chronic medications, migraines previously treated at headache center. Triglycerides at one point were 305, LDL 134, HDL 21. Baseline triglycerides have been as high as the 1900 range.  She used to see [name] had [place] [facility] years ago. 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 No prior evidence of pancreatitis. 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  In her 40's started having cataracts. She was taking the statins at the time  She denies any exertional anginal symptoms, no syncope, no bleeding, no orthopnea, no PND.  She's had quite a few statin intolerances. She also continues to smoke.  She does drink alcohol, her AST has been elevated in the past. Only drinking wine on weekends.   She lost 15 pounds after hemoglobin A1c was 6.6 and 02/16/2015.. Right leg pain on Livalo.   10/01/17-no syncope bleeding orthopnea PND.  Occasional GERD, abdominal discomfort.  Family history of AAA.  Still smoking but trying to quit.  No chest pain.  Hemoglobin A1c went back up after gaining back 15 pounds.  Trying to alter diet.    Past Medical History:  Diagnosis Date  . Complete hearing loss of right ear   . Esophageal reflux   . Hyperlipidemia   . Hypertension   . Migraine     Past Surgical History:  Procedure Laterality Date  . APPENDECTOMY    . hysterectomy, removal of right ovary    . LAPAROTOMY       Current Outpatient Medications  Medication Sig Dispense Refill  . ALPRAZolam (XANAX) 0.5 MG tablet Take 0.5 mg by mouth at bedtime.     Marland Kitchen atenolol (TENORMIN) 100 MG tablet Take 100 mg by mouth daily.     . Biotin  5000 MCG CAPS Take 1 capsule by mouth daily.    . cyclobenzaprine (FLEXERIL) 10 MG tablet Take 10 mg by mouth at bedtime.     Marland Kitchen esomeprazole (NEXIUM) 40 MG capsule Take 40 mg by mouth daily at 12 noon.    . fenofibrate 160 MG tablet TAKE 1 TABLET BY MOUTH  DAILY 30 tablet 0  . fexofenadine (ALLEGRA) 180 MG tablet Take 180 mg by mouth as needed for allergies or rhinitis.    . indapamide (LOZOL) 2.5 MG tablet Take 2.5 mg by mouth daily.     Marland Kitchen levothyroxine (SYNTHROID, LEVOTHROID) 50 MCG tablet Take 50 mcg by mouth daily before breakfast.     . metoCLOPramide (REGLAN) 10 MG tablet Take 10 mg by mouth as needed for nausea (Take one tablet every 4 to 6 hours with migraine headaches as directed for her migrains).    . Omega-3 Fatty Acids (FISH OIL) 1000 MG CAPS Take 3 capsules by mouth 2 (two) times daily.    . pravastatin (PRAVACHOL) 40 MG tablet Take 1 tablet (40 mg total) by mouth every evening. PLEASE CALL TO SCHEDULE APPT FOR ANY MORE REFILLS-THANKS! 1ST ATTEMPT [phone] 30 tablet 0  . rizatriptan (MAXALT-MLT) 10 MG disintegrating tablet Take 10 mg by mouth as needed for migraine (Take 1 pill at headache onset and  1 pill two hours later if any headache remains. Maximum 2 pills in twenty-four hours as directed). May repeat in 2 hours if needed    . sertraline (ZOLOFT) 100 MG tablet Take 100 mg by mouth daily.     . valACYclovir (VALTREX) 1000 MG tablet Take one tablet by mouth twice a day  3  . VIVELLE-DOT 0.1 MG/24HR patch Place 1 patch onto the skin 2 (two) times a week.      No current facility-administered medications for this visit.     Allergies:   Dexilant [dexlansoprazole]; Crestor [rosuvastatin]; Lipitor [atorvastatin]; Sulfamethoxazole-trimethoprim; Augmentin [amoxicillin-pot clavulanate]; and Cipro hc [ciprofloxacin-hydrocortisone]    Social History:  The patient  reports that she has been smoking cigarettes.  She has a 7.50 pack-year smoking history. she has never used smokeless  tobacco. She reports that she drinks alcohol. She reports that she does not use drugs.   Family History:  The patient's family history includes Cancer - Other in her maternal grandmother and paternal grandmother; Cancer - Prostate in her father; Dementia in her mother; Hyperlipidemia in her father; Hypertension in her father, mother, and paternal grandfather; Thyroid disease in her mother.    ROS:  Please see the history of present illness.   All others negative.  PHYSICAL EXAM: VS:  BP 130/88   Pulse 61   Ht 5\' 6"  (1.676 m)   Wt 159 lb 6.4 oz (72.3 kg)   SpO2 97%   BMI 25.73 kg/m  , BMI Body mass index is 25.73 kg/m. GEN: Well nourished, well developed, in no acute distress  HEENT: normal  Neck: no JVD, carotid bruits, or masses Cardiac: RRR; 1/6 murmurs, rubs, or gallops,no edema  Respiratory:  clear to auscultation bilaterally, normal work of breathing GI: soft, nontender, nondistended, + BS MS: no deformity or atrophy  Skin: warm and dry, no rash Neuro:  Strength and sensation are intact Psych: euthymic mood, full affect   EKG:  EKG is ordered today. The ekg ordered today demonstrates 10/01/17 - NSR normal, personally viewed.06/16/16-sinus rhythm, 62 no other significant abnormality is. 06/14/15-sinus bradycardia rate 56 with no other abnormalities.   Recent Labs: No results found for requested labs within last 8760 hours.    Lipid Panel    Component Value Date/Time   CHOL 211 (H) 06/16/2016 0839   CHOL 239 (H) 06/14/2015 0936   TRIG 339 (H) 06/16/2016 0839   TRIG 304 (H) 06/14/2015 0936   TRIG 923 (HH) 10/05/2006 0825   HDL 14 (L) 06/16/2016 0839   HDL 18 (L) 06/14/2015 0936   CHOLHDL 15.1 (H) 06/16/2016 0839   VLDL 68 (H) 06/16/2016 0839   LDLCALC 129 06/16/2016 0839   LDLCALC 160 (H) 06/14/2015 0936   LDLDIRECT 91.2 02/02/2009 0821      Wt Readings from Last 3 Encounters:  10/01/17 159 lb 6.4 oz (72.3 kg)  06/16/16 159 lb 3.2 oz (72.2 kg)  06/14/15 149 lb  (67.6 kg)      Other studies Reviewed: Additional studies/ records that were reviewed today include: Prior lab work, EKG, office notes. Review of the above records demonstrates: As above  Labs: 05/2015: LDL- P number 3124, TC 239, LDL 160, TG 304, HDL 18, CK 178 (Fenofibrate 160mg ) 06/2014: LDL-P number 2379, TC 167, LDL 97, TG 276, HDL 15.  LFTs are slightly elevated.  This is different from 6 months ago but similar to 5 years ago (no records available in between) 12/2013:  LDL-P number 2392, TC 183, LDL  106, TG 264, HDL 24 (Fenofibrate 160 mg qd, fish oil 6 g/d, Livalo 2 mg qd)  CK - normal   ASSESSMENT AND PLAN:  Mixed hyperlipidemia -Fish oil, fenofibrate, pravastatin -Triglycerides 339.  Originally they were 1900. -Diet modification.  Diabetes mellitus - Last hemoglobin A1c 7.6.  She is not currently taking metformin she would like to try to lose 15 pounds, dietary modification.  GERD/intermittent abdominal discomfort/family history of AAA/years of smoking. - We will check an abdominal ultrasound focused on ruling out abdominal aortic aneurysm.   Disposition:   She will follow-up with me in one year.   Signed, Donato SchultzMark Finesse Fielder, MD  10/01/2017 5:23 PM    [facility] [address], [place], [place]  [address] Phone: [phone]; Fax: [phone]

## 2017-10-01 NOTE — Patient Instructions (Signed)
Medication Instructions:  Continue medications as listed.  Testing/Procedures: Your physician has requested that you have an abdominal aorta duplex. During this test, an ultrasound is used to evaluate the aorta. Allow 30 minutes for this exam. Do not eat after midnight the day before and avoid carbonated beverages  Follow-Up: Follow up in 1 year with Dr. Anne FuSkains.  You will receive a letter in the mail 2 months before you are due.  Please call us when you receive this letter to schedule your follow up appointment.  If you need a refill on your cardiac medications before your next appointment, please call your pharmacy.  Thank you for choosing [facility]!!

## 2017-10-01 NOTE — Addendum Note (Signed)
Addended by: Sharin GraveFLEMING, Chantil Bari J on: 10/01/2017 05:51 PM   Modules accepted: Orders

## 2017-10-05 ENCOUNTER — Ambulatory Visit
Admission: RE | Admit: 2017-10-05 | Discharge: 2017-10-05 | Disposition: A | Discharge status: Home / Self Care | Payer: 59 | Source: Ambulatory Visit | Attending: Obstetrics & Gynecology | Admitting: Obstetrics & Gynecology

## 2017-10-05 ENCOUNTER — Other Ambulatory Visit: Payer: Self-pay | Admitting: Obstetrics & Gynecology

## 2017-10-05 DIAGNOSIS — R921 Mammographic calcification found on diagnostic imaging of breast: Secondary | ICD-10-CM | POA: Diagnosis not present

## 2017-10-05 DIAGNOSIS — R928 Other abnormal and inconclusive findings on diagnostic imaging of breast: Secondary | ICD-10-CM

## 2017-10-13 ENCOUNTER — Inpatient Hospital Stay (HOSPITAL_COMMUNITY): Admission: RE | Admit: 2017-10-13 | Payer: 59 | Source: Ambulatory Visit | Attending: Cardiology | Admitting: Cardiology

## 2017-10-14 ENCOUNTER — Ambulatory Visit
Admission: RE | Admit: 2017-10-14 | Discharge: 2017-10-14 | Disposition: A | Discharge status: Home / Self Care | Payer: 59 | Source: Ambulatory Visit | Attending: Obstetrics & Gynecology | Admitting: Obstetrics & Gynecology

## 2017-10-14 DIAGNOSIS — N6321 Unspecified lump in the left breast, upper outer quadrant: Secondary | ICD-10-CM | POA: Diagnosis not present

## 2017-10-14 DIAGNOSIS — R921 Mammographic calcification found on diagnostic imaging of breast: Secondary | ICD-10-CM | POA: Diagnosis not present

## 2017-10-14 DIAGNOSIS — N281 Cyst of kidney, acquired: Secondary | ICD-10-CM | POA: Diagnosis not present

## 2017-10-16 ENCOUNTER — Other Ambulatory Visit (HOSPITAL_COMMUNITY): Payer: 59

## 2017-10-21 DIAGNOSIS — I83812 Varicose veins of left lower extremities with pain: Secondary | ICD-10-CM | POA: Diagnosis not present

## 2017-10-21 DIAGNOSIS — I8312 Varicose veins of left lower extremity with inflammation: Secondary | ICD-10-CM | POA: Diagnosis not present

## 2017-10-23 ENCOUNTER — Ambulatory Visit (HOSPITAL_COMMUNITY)
Admission: RE | Admit: 2017-10-23 | Discharge: 2017-10-23 | Disposition: A | Discharge status: Home / Self Care | Payer: 59 | Source: Ambulatory Visit | Attending: Cardiology | Admitting: Cardiology

## 2017-10-23 DIAGNOSIS — R109 Unspecified abdominal pain: Secondary | ICD-10-CM | POA: Diagnosis not present

## 2017-10-23 DIAGNOSIS — F17200 Nicotine dependence, unspecified, uncomplicated: Secondary | ICD-10-CM | POA: Diagnosis not present

## 2017-10-23 DIAGNOSIS — Z8249 Family history of ischemic heart disease and other diseases of the circulatory system: Secondary | ICD-10-CM

## 2017-10-30 ENCOUNTER — Other Ambulatory Visit: Payer: Self-pay | Admitting: *Deleted

## 2017-10-30 DIAGNOSIS — R935 Abnormal findings on diagnostic imaging of other abdominal regions, including retroperitoneum: Secondary | ICD-10-CM

## 2017-10-30 DIAGNOSIS — R109 Unspecified abdominal pain: Secondary | ICD-10-CM

## 2017-11-02 ENCOUNTER — Telehealth: Payer: Self-pay | Admitting: Cardiology

## 2017-11-02 NOTE — Telephone Encounter (Signed)
Reviewed information of AAA u/s with patient again.  Advised [facility] reviews information and reason for referral to determine how soon a pt needs to be seen.  Also advised pt to c/b to their office to see if they have a cancellation list she can be placed on.  She would like Dr Anne FuSkains to be made aware of the appt date.  Will forward to him for his knowledge.

## 2017-11-02 NOTE — Telephone Encounter (Signed)
New message    Patient calling with questions about referral sent to Dr Randie Heinzain. Patient scheduled to see Dr. Randie Heinzain in March, patient wants to know if that is an appropriate wait for appointment verus her diagnosis. Patient having having pain in upper abdomen. Please call.

## 2017-11-03 DIAGNOSIS — I8312 Varicose veins of left lower extremity with inflammation: Secondary | ICD-10-CM | POA: Diagnosis not present

## 2017-11-03 DIAGNOSIS — I83892 Varicose veins of left lower extremities with other complications: Secondary | ICD-10-CM | POA: Diagnosis not present

## 2017-11-09 DIAGNOSIS — E119 Type 2 diabetes mellitus without complications: Secondary | ICD-10-CM | POA: Diagnosis not present

## 2017-11-27 ENCOUNTER — Other Ambulatory Visit: Payer: Self-pay

## 2017-11-27 ENCOUNTER — Encounter: Payer: Self-pay | Admitting: Vascular Surgery

## 2017-11-27 ENCOUNTER — Ambulatory Visit: Payer: 59 | Admitting: Vascular Surgery

## 2017-11-27 VITALS — BP 135/80 | HR 59 | Temp 98.7°F | Resp 16 | Ht 66.0 in | Wt 160.0 lb

## 2017-11-27 DIAGNOSIS — K551 Chronic vascular disorders of intestine: Secondary | ICD-10-CM | POA: Diagnosis not present

## 2017-11-27 NOTE — Progress Notes (Signed)
Patient ID: Darlene Saunders, female   DOB: 07-17-1956, 62 y.o.   MRN: [id number]  Reason for Consult: New Patient (Initial Visit) (Ref by Dr. Donato Schultz.  Abdominal pain, celiac/mesenteric artery stenosis. U/S 10/23/17.)   Referred by Darlene Brunette, MD  Subjective:     HPI:  Darlene Saunders is a 62 y.o. female here for evaluation of mesenteric artery stenosis.  She has pain that is in her left upper quadrant radiates to her back and occurs without notice.  She states that occasionally eating makes it feel better.  She was recently evaluated by Dr. Anne Fu for elevated triglycerides.  She had an ultrasound of her abdomen which demonstrated mesenteric artery stenosis for which she now presents.  She has a family history of abdominal aortic aneurysmal disease does not have any personal history.  She is a chronic longtime smoker also has hypertension diabetes and the aforementioned hyperlipidemia.  She does not take any antiplatelet agents.  She does take metformin and is on a statin drug.  She has never had open visceral vascular surgery.  She does not have any food fear postprandial pain or recent weight loss that was unintentional.  She is up-to-date on her colonoscopies and mammograms.  She has not had an upper GI evaluation in multiple years.  Past Medical History:  Diagnosis Date  . Complete hearing loss of right ear   . Esophageal reflux   . Hyperlipidemia   . Hypertension   . Migraine    Family History  Problem Relation Age of Onset  . Thyroid disease Mother   . Hypertension Mother   . Dementia Mother   . Hypertension Father   . Hyperlipidemia Father   . Cancer - Prostate Father   . Cancer - Other Maternal Grandmother   . Cancer - Other Paternal Grandmother   . Hypertension Paternal Grandfather    Past Surgical History:  Procedure Laterality Date  . APPENDECTOMY    . hysterectomy, removal of right ovary    . LAPAROTOMY      Short Social History:  Social History   Tobacco Use   . Smoking status: Current Some Day Smoker    Years: 15.00    Types: Cigarettes  . Smokeless tobacco: Never Used  Substance Use Topics  . Alcohol use: Yes    Comment: 1 glass per day    Allergies  Allergen Reactions  . Dexilant [Dexlansoprazole] Diarrhea  . Crestor [Rosuvastatin]     Muscle aches  . Lipitor [Atorvastatin]     Muscle aches  . Sulfamethoxazole-Trimethoprim Hives  . Augmentin [Amoxicillin-Pot Clavulanate] Hives  . Cipro Hc [Ciprofloxacin-Hydrocortisone]     Topical reaction    Current Outpatient Medications  Medication Sig Dispense Refill  . ALPRAZolam (XANAX) 0.5 MG tablet Take 0.5 mg by mouth at bedtime.     Marland Kitchen atenolol (TENORMIN) 100 MG tablet Take 100 mg by mouth daily.     . Biotin 5000 MCG CAPS Take 1 capsule by mouth daily.    . cyclobenzaprine (FLEXERIL) 10 MG tablet Take 10 mg by mouth at bedtime.     Marland Kitchen esomeprazole (NEXIUM) 40 MG capsule Take 40 mg by mouth daily at 12 noon.    . fenofibrate 160 MG tablet Take 1 tablet (160 mg total) by mouth daily. 90 tablet 3  . fexofenadine (ALLEGRA) 180 MG tablet Take 180 mg by mouth as needed for allergies or rhinitis.    . indapamide (LOZOL) 2.5 MG tablet Take 2.5 mg by  mouth daily.     Marland Kitchen. levothyroxine (SYNTHROID, LEVOTHROID) 50 MCG tablet Take 50 mcg by mouth daily before breakfast.     . metFORMIN (GLUCOPHAGE) 500 MG tablet Take by mouth 2 (two) times daily with a meal.    . metoCLOPramide (REGLAN) 10 MG tablet Take 10 mg by mouth as needed for nausea (Take one tablet every 4 to 6 hours with migraine headaches as directed for her migrains).    . Omega-3 Fatty Acids (FISH OIL) 1000 MG CAPS Take 3 capsules by mouth 2 (two) times daily.    . pravastatin (PRAVACHOL) 40 MG tablet Take 1 tablet (40 mg total) by mouth every evening. 90 tablet 3  . rizatriptan (MAXALT-MLT) 10 MG disintegrating tablet Take 10 mg by mouth as needed for migraine (Take 1 pill at headache onset and 1 pill two hours later if any headache  remains. Maximum 2 pills in twenty-four hours as directed). May repeat in 2 hours if needed    . sertraline (ZOLOFT) 100 MG tablet Take 100 mg by mouth daily.     . ValACYclovir HCl (VALTREX PO) Take 400 mg by mouth.    Marland Kitchen. VIVELLE-DOT 0.1 MG/24HR patch Place 1 patch onto the skin 2 (two) times a week.      No current facility-administered medications for this visit.     Review of Systems  Constitutional:  Constitutional negative. HENT: HENT negative.  Eyes: Eyes negative.  Cardiovascular: Positive for chest tightness.  GI: Positive for abdominal pain.  GU: Genitourinary negative. Musculoskeletal: Musculoskeletal negative.  Skin: Skin negative.  Neurological: Neurological negative. Hematologic: Hematologic/lymphatic negative.  Psychiatric: Psychiatric negative.        Objective:  Objective   Vitals:   11/27/17 1438  BP: 135/80  Pulse: (!) 59  Resp: 16  Temp: 98.7 F (37.1 C)  TempSrc: Oral  SpO2: 99%  Weight: 160 lb (72.6 kg)  Height: 5\' 6"  (1.676 m)   Body mass index is 25.82 kg/m.  Physical Exam  Constitutional: She appears well-developed.  HENT:  Head: Normocephalic.  Eyes: Pupils are equal, round, and reactive to light.  Neck: Normal range of motion.  Cardiovascular: Normal rate.  Pulses:      Radial pulses are 2+ on the right side, and 2+ on the left side.       Femoral pulses are 2+ on the right side, and 2+ on the left side.      Popliteal pulses are 2+ on the right side, and 2+ on the left side.       Dorsalis pedis pulses are 2+ on the right side, and 2+ on the left side.       Posterior tibial pulses are 2+ on the right side, and 2+ on the left side.  Pulmonary/Chest: Effort normal.  Abdominal: Soft. She exhibits no mass.  Musculoskeletal: Normal range of motion. She exhibits no edema.  Neurological: She is alert.  Skin: Skin is warm and dry.  Psychiatric: She has a normal mood and affect. Her behavior is normal. Thought content normal.     Data:      Assessment/Plan:     62 year old female with left upper quadrant pain radiating into her chest and back recently had ultrasound which demonstrated mesenteric artery stenosis in all 3 of her visceral vessels.  Her pain is not consistent with mesenteric ischemia but given the concern for the mesenteric artery stenosis I think we should further interrogate this.  We will also have her seen by her  gastroenterologist given her symptoms do somewhat appear to be GI in nature.  We will also get CTA and have her follow-up in 3-4 weeks after both of the other evaluations.  I counseled her on smoking cessation and asked her to take aspirin.    Maeola Harman MD [facility]

## 2017-11-30 ENCOUNTER — Other Ambulatory Visit: Payer: Self-pay

## 2017-11-30 DIAGNOSIS — K559 Vascular disorder of intestine, unspecified: Secondary | ICD-10-CM

## 2017-12-01 ENCOUNTER — Encounter: Payer: Self-pay | Admitting: Family Medicine

## 2017-12-16 DIAGNOSIS — I8312 Varicose veins of left lower extremity with inflammation: Secondary | ICD-10-CM | POA: Diagnosis not present

## 2017-12-16 DIAGNOSIS — I83892 Varicose veins of left lower extremities with other complications: Secondary | ICD-10-CM | POA: Diagnosis not present

## 2017-12-25 ENCOUNTER — Ambulatory Visit: Payer: 59 | Admitting: Vascular Surgery

## 2017-12-25 ENCOUNTER — Other Ambulatory Visit: Payer: 59

## 2017-12-28 DIAGNOSIS — L814 Other melanin hyperpigmentation: Secondary | ICD-10-CM | POA: Diagnosis not present

## 2017-12-28 DIAGNOSIS — L570 Actinic keratosis: Secondary | ICD-10-CM | POA: Diagnosis not present

## 2017-12-28 DIAGNOSIS — L811 Chloasma: Secondary | ICD-10-CM | POA: Diagnosis not present

## 2017-12-31 DIAGNOSIS — I8311 Varicose veins of right lower extremity with inflammation: Secondary | ICD-10-CM | POA: Diagnosis not present

## 2017-12-31 DIAGNOSIS — R140 Abdominal distension (gaseous): Secondary | ICD-10-CM | POA: Diagnosis not present

## 2017-12-31 DIAGNOSIS — R1013 Epigastric pain: Secondary | ICD-10-CM | POA: Diagnosis not present

## 2018-01-01 ENCOUNTER — Telehealth: Payer: Self-pay | Admitting: Vascular Surgery

## 2018-01-01 NOTE — Telephone Encounter (Signed)
----- Message from Maeola Harman, MD sent at 12/21/2017 11:43 AM EDT ----- Regarding: RE: GI referral Ok to delay my office visit then.  ----- Message ----- From: Darlene Saunders Sent: 12/21/2017  10:42 AM To: Maeola Harman, MD Subject: RE: GI referral                                Hi Dr. Randie Heinz,  I have called every office multiple times and unfortunately everyone is scheduling out into May or June.   ----- Message ----- From: Maeola Harman, MD Sent: 12/11/2017   4:33 PM To: Darlene Saunders Subject: RE: GI referral                                Would be better for patient if gi evaluates first.   Apolinar Junes  ----- Message ----- From: Darlene Saunders Sent: 12/11/2017  10:17 AM To: Maeola Harman, MD Subject: Annell Greening: GI referral                                Dr. Randie Heinz,  Unfortunately no GI office has an appointment available before they come back and see you on 12/25/17.  Does the GI consult have to be done before they see you or can it be done any time?  ----- Message ----- From: Sharee Pimple, RN Sent: 12/10/2017   2:12 PM To: Darlene Saunders Subject: RE: GI referral                                Let Randie Heinz know tomorrow to see if this is OK ----- Message ----- From: Darlene Saunders Sent: 12/10/2017  12:22 PM To: Sharee Pimple, RN Subject: RE: GI referral                                Dr. Jennye Boroughs office does not have anyone else that can see the pt and since it's been about 5 years since the pt has been there so she is considered a new pt.  I called around and no one has an appt before 12/25/17.  ----- Message ----- From: Sharee Pimple, RN Sent: 12/01/2017   3:21 PM To: Sallyanne Kuster Roczniak Subject: RE: GI referral                                According to [facility], she is already a patient there at that office, see if she can get in with someone else to evaluate her Left upper quadrant  pain. Also, when is her CTA scheduled? ----- Message ----- From: Darlene Saunders Sent: 12/01/2017   2:58 PM To: Sharee Pimple, RN Subject: GI referral                                    Randie Heinz wanted this pt to see Dr. Kinnie Scales at [facility] before they came back into the office 12/23/17.  Their office called back and their first appt is not until May.  I did not know if this pt specifically HAS to see this doctor, in which case you or Randie HeinzCain may have to call, or if they just need to see any GI.

## 2018-01-18 DIAGNOSIS — I8312 Varicose veins of left lower extremity with inflammation: Secondary | ICD-10-CM | POA: Diagnosis not present

## 2018-01-18 DIAGNOSIS — Z8719 Personal history of other diseases of the digestive system: Secondary | ICD-10-CM | POA: Diagnosis not present

## 2018-01-18 DIAGNOSIS — R1013 Epigastric pain: Secondary | ICD-10-CM | POA: Diagnosis not present

## 2018-01-29 ENCOUNTER — Telehealth: Payer: Self-pay | Admitting: Vascular Surgery

## 2018-01-29 NOTE — Telephone Encounter (Signed)
Resched CTA 03/08/18 at 10:30 and MD 03/12/18 at 3:45. Spoke to pt to resch.

## 2018-01-29 NOTE — Telephone Encounter (Signed)
-----   Message from Maeola Harman, MD sent at 01/28/2018  2:09 PM EDT ----- Regarding: RE: GI  It is fine to push the appointments back until after the endoscopy.  Apolinar Junes  ----- Message ----- From: Jena Gauss Sent: 01/28/2018  10:03 AM To: Maeola Harman, MD Subject: GI                                             You had referred this pt to GI with a CTA and appt with you after. She saw her GI on 01/18/18 and has a CTA sched for 02/02/18 and appt with you on 02/05/18.  She is having an endoscopy on 02/19/18. She thinks she needs to have the endoscopy before she has the CTA and sees you.  Is that true or is it fine that the endoscopy is after her appt with you?

## 2018-02-02 ENCOUNTER — Other Ambulatory Visit: Payer: 59

## 2018-02-02 DIAGNOSIS — I8311 Varicose veins of right lower extremity with inflammation: Secondary | ICD-10-CM | POA: Diagnosis not present

## 2018-02-05 ENCOUNTER — Ambulatory Visit: Payer: 59 | Admitting: Vascular Surgery

## 2018-02-08 DIAGNOSIS — M62838 Other muscle spasm: Secondary | ICD-10-CM | POA: Diagnosis not present

## 2018-02-08 DIAGNOSIS — E119 Type 2 diabetes mellitus without complications: Secondary | ICD-10-CM | POA: Diagnosis not present

## 2018-02-17 DIAGNOSIS — I8312 Varicose veins of left lower extremity with inflammation: Secondary | ICD-10-CM | POA: Diagnosis not present

## 2018-02-19 DIAGNOSIS — K3189 Other diseases of stomach and duodenum: Secondary | ICD-10-CM | POA: Diagnosis not present

## 2018-02-19 DIAGNOSIS — K2930 Chronic superficial gastritis without bleeding: Secondary | ICD-10-CM | POA: Diagnosis not present

## 2018-02-19 DIAGNOSIS — R1013 Epigastric pain: Secondary | ICD-10-CM | POA: Diagnosis not present

## 2018-02-22 DIAGNOSIS — N3090 Cystitis, unspecified without hematuria: Secondary | ICD-10-CM | POA: Diagnosis not present

## 2018-03-03 DIAGNOSIS — I8312 Varicose veins of left lower extremity with inflammation: Secondary | ICD-10-CM | POA: Diagnosis not present

## 2018-03-05 ENCOUNTER — Ambulatory Visit: Payer: 59 | Admitting: Vascular Surgery

## 2018-03-05 ENCOUNTER — Other Ambulatory Visit: Payer: 59

## 2018-03-08 ENCOUNTER — Other Ambulatory Visit: Payer: 59

## 2018-03-08 ENCOUNTER — Ambulatory Visit
Admission: RE | Admit: 2018-03-08 | Discharge: 2018-03-08 | Disposition: A | Discharge status: Home / Self Care | Payer: 59 | Source: Ambulatory Visit | Attending: Vascular Surgery | Admitting: Vascular Surgery

## 2018-03-08 DIAGNOSIS — K559 Vascular disorder of intestine, unspecified: Secondary | ICD-10-CM

## 2018-03-08 DIAGNOSIS — K551 Chronic vascular disorders of intestine: Secondary | ICD-10-CM | POA: Diagnosis not present

## 2018-03-08 MED ORDER — IOPAMIDOL (ISOVUE-370) INJECTION 76%
75.0000 mL | Freq: Once | INTRAVENOUS | Status: AC | PRN
  Administered 2018-03-08: 75 mL via INTRAVENOUS

## 2018-03-12 ENCOUNTER — Ambulatory Visit (INDEPENDENT_AMBULATORY_CARE_PROVIDER_SITE_OTHER): Payer: 59 | Admitting: Vascular Surgery

## 2018-03-12 ENCOUNTER — Encounter: Payer: Self-pay | Admitting: Vascular Surgery

## 2018-03-12 VITALS — BP 134/74 | HR 64 | Resp 16 | Ht 66.0 in | Wt 159.0 lb

## 2018-03-12 DIAGNOSIS — K551 Chronic vascular disorders of intestine: Secondary | ICD-10-CM

## 2018-03-12 NOTE — Progress Notes (Signed)
Patient ID: Darlene Saunders Klausing, female   DOB: 07/24/1956, 62 y.o.   MRN: [id number]  Reason for Consult: Follow-up   Referred by Merri BrunetteSmith, Candace, MD  Subjective:     HPI:  Darlene Saunders Darlene Saunders is a 62 y.o. female with history of left upper quadrant pain.  She was evaluated with duplex and found to have 70% stenosis of her visceral vessels.  She is now been evaluated by GI with unknown reasons for a complaint.  She follows up with CTA today.  She states that she has not had weight loss.  She does not notice any change with pain with eating.  Pain is constant left upper quadrant radiating to her back.  She did have some musculoskeletal pain in the same area but majority of time it is nonmusculoskeletal and feels central in nature.  She continues to smoke currently 6 to 7 cigarettes/day.  Past Medical History:  Diagnosis Date  . Complete hearing loss of right ear   . Esophageal reflux   . Hyperlipidemia   . Hypertension   . Migraine    Family History  Problem Relation Age of Onset  . Thyroid disease Mother   . Hypertension Mother   . Dementia Mother   . Hypertension Father   . Hyperlipidemia Father   . Cancer - Prostate Father   . Cancer - Other Maternal Grandmother   . Cancer - Other Paternal Grandmother   . Hypertension Paternal Grandfather    Past Surgical History:  Procedure Laterality Date  . APPENDECTOMY    . hysterectomy, removal of right ovary    . LAPAROTOMY      Short Social History:  Social History   Tobacco Use  . Smoking status: Current Some Day Smoker    Years: 15.00    Types: Cigarettes  . Smokeless tobacco: Never Used  Substance Use Topics  . Alcohol use: Yes    Comment: 1 glass per day    Allergies  Allergen Reactions  . Dexilant [Dexlansoprazole] Diarrhea  . Crestor [Rosuvastatin]     Muscle aches  . Lipitor [Atorvastatin]     Muscle aches  . Sulfamethoxazole-Trimethoprim Hives  . Augmentin [Amoxicillin-Pot Clavulanate] Hives  . Cipro Hc  [Ciprofloxacin-Hydrocortisone]     Topical reaction    Current Outpatient Medications  Medication Sig Dispense Refill  . ALPRAZolam (XANAX) 0.5 MG tablet Take 0.5 mg by mouth at bedtime.     Marland Kitchen. atenolol (TENORMIN) 100 MG tablet Take 100 mg by mouth daily.     . Biotin 5000 MCG CAPS Take 1 capsule by mouth daily.    . cyclobenzaprine (FLEXERIL) 10 MG tablet Take 10 mg by mouth at bedtime.     Marland Kitchen. esomeprazole (NEXIUM) 40 MG capsule Take 40 mg by mouth daily at 12 noon.    . fenofibrate 160 MG tablet Take 1 tablet (160 mg total) by mouth daily. 90 tablet 3  . fexofenadine (ALLEGRA) 180 MG tablet Take 180 mg by mouth as needed for allergies or rhinitis.    . indapamide (LOZOL) 2.5 MG tablet Take 2.5 mg by mouth daily.     Marland Kitchen. levothyroxine (SYNTHROID, LEVOTHROID) 50 MCG tablet Take 50 mcg by mouth daily before breakfast.     . metFORMIN (GLUCOPHAGE) 500 MG tablet Take by mouth 2 (two) times daily with a meal.    . metoCLOPramide (REGLAN) 10 MG tablet Take 10 mg by mouth as needed for nausea (Take one tablet every 4 to 6 hours with migraine headaches  as directed for her migrains).    . Omega-3 Fatty Acids (FISH OIL) 1000 MG CAPS Take 3 capsules by mouth 2 (two) times daily.    . pravastatin (PRAVACHOL) 40 MG tablet Take 1 tablet (40 mg total) by mouth every evening. 90 tablet 3  . rizatriptan (MAXALT-MLT) 10 MG disintegrating tablet Take 10 mg by mouth as needed for migraine (Take 1 pill at headache onset and 1 pill two hours later if any headache remains. Maximum 2 pills in twenty-four hours as directed). May repeat in 2 hours if needed    . sertraline (ZOLOFT) 100 MG tablet Take 100 mg by mouth daily.     . ValACYclovir HCl (VALTREX PO) Take 400 mg by mouth.    Marland Kitchen VIVELLE-DOT 0.1 MG/24HR patch Place 1 patch onto the skin 2 (two) times a week.      No current facility-administered medications for this visit.     Review of Systems  Constitutional:  Constitutional negative. HENT: HENT negative.    Eyes: Eyes negative.  Respiratory: Respiratory negative.  Cardiovascular: Cardiovascular negative.  GI: Positive for abdominal pain.  GU: Positive for difficulty urinating.  Neurological: Neurological negative. Hematologic: Hematologic/lymphatic negative.  Psychiatric: Psychiatric negative.        Objective:  Objective   Vitals:   03/12/18 1544  BP: 134/74  Pulse: 64  Resp: 16  SpO2: 100%  Weight: 159 lb (72.1 kg)  Height: 5\' 6"  (1.676 m)   Body mass index is 25.66 kg/m.  Physical Exam  Constitutional: She is oriented to person, place, and time. She appears well-developed.  HENT:  Head: Normocephalic.  Eyes: Pupils are equal, round, and reactive to light.  Neck: Normal range of motion.  Cardiovascular: Normal rate.  Pulses:      Carotid pulses are 2+ on the right side, and 2+ on the left side.      Radial pulses are 2+ on the right side, and 2+ on the left side.       Femoral pulses are 2+ on the right side, and 2+ on the left side. Pulmonary/Chest: Effort normal.  Abdominal: Soft. She exhibits no mass.  Musculoskeletal: Normal range of motion.  Neurological: She is alert and oriented to person, place, and time.  Skin: Skin is warm and dry. Capillary refill takes less than 2 seconds.  Psychiatric: She has a normal mood and affect. Her behavior is normal. Judgment and thought content normal.    Data: IMPRESSION: VASCULAR  Atherosclerotic disease throughout the abdominal aorta with areas of irregular plaque. Negative for an abdominal aortic aneurysm or dissection.  Main mesenteric arteries are patent with mild narrowing. Vascular disease pattern and severity is not suggestive for mesenteric ischemia.  Mild-to-moderate narrowing in left renal artery.   Saunders reviewed the CT images with the patient did not demonstrate any significant stenosis that would be treatable.     Assessment/Plan:     62 year old female with left upper quadrant abdominal pain and  mesenteric stenosis by duplex.  CT scan does not corroborate the amount of stenosis from the ultrasound imaging.  We have discussed smoking cessation greater than 3 minutes.  She states that by 1 year she will quit smoking.  She has any issues Saunders am happy to see her sooner otherwise we will follow her up in the 1 year with repeat mesenteric duplex.     Maeola Harman MD [facility]

## 2018-03-16 DIAGNOSIS — K589 Irritable bowel syndrome without diarrhea: Secondary | ICD-10-CM | POA: Diagnosis not present

## 2018-03-29 DIAGNOSIS — H04123 Dry eye syndrome of bilateral lacrimal glands: Secondary | ICD-10-CM | POA: Diagnosis not present

## 2018-03-29 DIAGNOSIS — E119 Type 2 diabetes mellitus without complications: Secondary | ICD-10-CM | POA: Diagnosis not present

## 2018-05-11 DIAGNOSIS — N760 Acute vaginitis: Secondary | ICD-10-CM | POA: Diagnosis not present

## 2018-05-11 DIAGNOSIS — N951 Menopausal and female climacteric states: Secondary | ICD-10-CM | POA: Diagnosis not present

## 2018-06-08 DIAGNOSIS — M7701 Medial epicondylitis, right elbow: Secondary | ICD-10-CM | POA: Diagnosis not present

## 2018-07-21 DIAGNOSIS — N951 Menopausal and female climacteric states: Secondary | ICD-10-CM | POA: Diagnosis not present

## 2018-07-25 DIAGNOSIS — E119 Type 2 diabetes mellitus without complications: Secondary | ICD-10-CM | POA: Insufficient documentation

## 2018-07-25 DIAGNOSIS — I10 Essential (primary) hypertension: Secondary | ICD-10-CM | POA: Insufficient documentation

## 2018-07-25 DIAGNOSIS — S61223A Laceration with foreign body of left middle finger without damage to nail, initial encounter: Secondary | ICD-10-CM | POA: Diagnosis not present

## 2018-08-09 DIAGNOSIS — E782 Mixed hyperlipidemia: Secondary | ICD-10-CM | POA: Diagnosis not present

## 2018-08-09 DIAGNOSIS — E119 Type 2 diabetes mellitus without complications: Secondary | ICD-10-CM | POA: Diagnosis not present

## 2018-08-09 DIAGNOSIS — I10 Essential (primary) hypertension: Secondary | ICD-10-CM | POA: Diagnosis not present

## 2018-08-09 DIAGNOSIS — E039 Hypothyroidism, unspecified: Secondary | ICD-10-CM | POA: Diagnosis not present

## 2018-08-13 DIAGNOSIS — Z1211 Encounter for screening for malignant neoplasm of colon: Secondary | ICD-10-CM | POA: Diagnosis not present

## 2018-08-26 ENCOUNTER — Emergency Department (HOSPITAL_COMMUNITY): Payer: 59

## 2018-08-26 ENCOUNTER — Encounter (HOSPITAL_COMMUNITY): Payer: Self-pay | Admitting: Emergency Medicine

## 2018-08-26 ENCOUNTER — Emergency Department (HOSPITAL_COMMUNITY)
Admission: EM | Admit: 2018-08-26 | Discharge: 2018-08-27 | Disposition: A | Discharge status: Home / Self Care | Payer: 59 | Attending: Emergency Medicine | Admitting: Emergency Medicine

## 2018-08-26 DIAGNOSIS — W108XXA Fall (on) (from) other stairs and steps, initial encounter: Secondary | ICD-10-CM | POA: Diagnosis not present

## 2018-08-26 DIAGNOSIS — S199XXA Unspecified injury of neck, initial encounter: Secondary | ICD-10-CM | POA: Diagnosis not present

## 2018-08-26 DIAGNOSIS — S82855A Nondisplaced trimalleolar fracture of left lower leg, initial encounter for closed fracture: Secondary | ICD-10-CM | POA: Insufficient documentation

## 2018-08-26 DIAGNOSIS — Y998 Other external cause status: Secondary | ICD-10-CM | POA: Diagnosis not present

## 2018-08-26 DIAGNOSIS — Y9289 Other specified places as the place of occurrence of the external cause: Secondary | ICD-10-CM | POA: Diagnosis not present

## 2018-08-26 DIAGNOSIS — F17210 Nicotine dependence, cigarettes, uncomplicated: Secondary | ICD-10-CM | POA: Insufficient documentation

## 2018-08-26 DIAGNOSIS — I10 Essential (primary) hypertension: Secondary | ICD-10-CM | POA: Insufficient documentation

## 2018-08-26 DIAGNOSIS — S82852A Displaced trimalleolar fracture of left lower leg, initial encounter for closed fracture: Secondary | ICD-10-CM | POA: Diagnosis not present

## 2018-08-26 DIAGNOSIS — Z7984 Long term (current) use of oral hypoglycemic drugs: Secondary | ICD-10-CM | POA: Insufficient documentation

## 2018-08-26 DIAGNOSIS — E785 Hyperlipidemia, unspecified: Secondary | ICD-10-CM | POA: Insufficient documentation

## 2018-08-26 DIAGNOSIS — S0990XA Unspecified injury of head, initial encounter: Secondary | ICD-10-CM | POA: Diagnosis not present

## 2018-08-26 DIAGNOSIS — Y939 Activity, unspecified: Secondary | ICD-10-CM | POA: Insufficient documentation

## 2018-08-26 DIAGNOSIS — E119 Type 2 diabetes mellitus without complications: Secondary | ICD-10-CM | POA: Insufficient documentation

## 2018-08-26 DIAGNOSIS — S0003XA Contusion of scalp, initial encounter: Secondary | ICD-10-CM

## 2018-08-26 DIAGNOSIS — Z79899 Other long term (current) drug therapy: Secondary | ICD-10-CM | POA: Insufficient documentation

## 2018-08-26 DIAGNOSIS — S99922A Unspecified injury of left foot, initial encounter: Secondary | ICD-10-CM | POA: Diagnosis present

## 2018-08-26 DIAGNOSIS — S0093XA Contusion of unspecified part of head, initial encounter: Secondary | ICD-10-CM | POA: Diagnosis not present

## 2018-08-26 DIAGNOSIS — W010XXA Fall on same level from slipping, tripping and stumbling without subsequent striking against object, initial encounter: Secondary | ICD-10-CM

## 2018-08-26 HISTORY — DX: Type 2 diabetes mellitus without complications: E11.9

## 2018-08-26 MED ORDER — ONDANSETRON 4 MG PO TBDP
4.0000 mg | ORAL_TABLET | Freq: Once | ORAL | Status: AC | PRN
  Administered 2018-08-26: 4 mg via ORAL
  Filled 2018-08-26: qty 1

## 2018-08-26 MED ORDER — OXYCODONE-ACETAMINOPHEN 5-325 MG PO TABS: 1.0000 | ORAL_TABLET | ORAL | Status: DC | PRN

## 2018-08-26 MED ORDER — FENTANYL CITRATE (PF) 100 MCG/2ML IJ SOLN
50.0000 ug | INTRAMUSCULAR | Status: AC | PRN
  Administered 2018-08-26 (×2): 50 ug via INTRAVENOUS
  Filled 2018-08-26 (×2): qty 2

## 2018-08-26 MED ORDER — FENTANYL CITRATE (PF) 100 MCG/2ML IJ SOLN
50.0000 ug | Freq: Once | INTRAMUSCULAR | Status: AC
  Administered 2018-08-26: 50 ug via INTRAVENOUS
  Filled 2018-08-26: qty 2

## 2018-08-26 NOTE — ED Triage Notes (Signed)
Pt reports she tripped up the steps. Pt denies LOC or dizziness. Pt has obvious deformity to L ankle.

## 2018-08-26 NOTE — ED Notes (Signed)
Patient transported to CT 

## 2018-08-26 NOTE — ED Provider Notes (Signed)
[facility] EMERGENCY DEPARTMENT Provider Note   CSN: [id number] Arrival date & time: 08/26/18  2133     History   Chief Complaint Chief Complaint  Patient presents with  . Foot Injury    HPI Darlene Saunders is a 62 y.o. female with a hx of NIDDM, HTN, HLD, migraine headaches, GERD presents to the Emergency Department complaining of acute, persistent left ankle pain with associated swelling and ecchymosis.  She reports she was standing on the bottom step of her deck when she stumbled and fell.  She denies loss of consciousness but does not know exactly what happened with the fall.  She did hit her head.  Patient reports her last oral intake of food was breakfast however she had 3 glasses of wine before the fall tonight.  Patient denies numbness or tingling in her foot.  She reports inability to bear weight on the left extremity.  She denies pain in the left knee or left hip.  She denies neck or back pain.  She is not taking an anticoagulant or aspirin.    Pt reports a hx of surgery on the left knee by [facility] - Dr. Madelon Lips and Dr. Charlett Blake.    The history is provided by the patient and medical records. No language interpreter was used.    Past Medical History:  Diagnosis Date  . Complete hearing loss of right ear   . Diabetes mellitus without complication (HCC)    Type 2   . Esophageal reflux   . Hyperlipidemia   . Hypertension   . Migraine     Patient Active Problem List   Diagnosis Date Noted  . Elevated hemoglobin A1c 06/14/2015  . Type 2 diabetes mellitus without complication (HCC) 06/14/2015  . Muscle pain 06/14/2015  . MIXED HYPERLIPIDEMIA 02/22/2009  . HYPERLIPIDEMIA, TYPE IV 02/22/2009  . AUTOIMMUNE DISEASE NOT ELSEWHERE  CLASSIFIED 02/22/2009    Past Surgical History:  Procedure Laterality Date  . APPENDECTOMY    . hysterectomy, removal of right ovary    . LAPAROTOMY    . ORTHOPEDIC SURGERY       OB History   None      Home  Medications    Prior to Admission medications   Medication Sig Start Date End Date Taking? Authorizing Provider  ALPRAZolam Prudy Feeler) 0.5 MG tablet Take 0.5 mg by mouth at bedtime.  06/09/15   [provider]  atenolol (TENORMIN) 100 MG tablet Take 100 mg by mouth daily.  12/11/13   [provider]  Biotin 5000 MCG CAPS Take 1 capsule by mouth daily.    [provider]  cyclobenzaprine (FLEXERIL) 10 MG tablet Take 10 mg by mouth at bedtime.  12/11/13   [provider]  esomeprazole (NEXIUM) 40 MG capsule Take 40 mg by mouth daily at 12 noon.    [provider]  fenofibrate 160 MG tablet Take 1 tablet (160 mg total) by mouth daily. 10/01/17   Jake Bathe, MD  fexofenadine (ALLEGRA) 180 MG tablet Take 180 mg by mouth as needed for allergies or rhinitis.    [provider]  indapamide (LOZOL) 2.5 MG tablet Take 2.5 mg by mouth daily.  12/11/13   [provider]  levothyroxine (SYNTHROID, LEVOTHROID) 50 MCG tablet Take 50 mcg by mouth daily before breakfast.  12/11/13   [provider]  metFORMIN (GLUCOPHAGE) 500 MG tablet Take by mouth 2 (two) times daily with a meal.    [provider]  metoCLOPramide (REGLAN) 10 MG tablet Take 10 mg by mouth as needed for nausea (Take one tablet every 4 to 6 hours with migraine headaches as directed for her migrains).    [provider]  Omega-3 Fatty Acids (FISH OIL) 1000 MG CAPS Take 3 capsules by mouth 2 (two) times daily.    [provider]  oxyCODONE (ROXICODONE) 5 MG immediate release tablet Take 1 tablet (5 mg total) by mouth every 6 (six) hours as needed for severe pain. 08/27/18   Monick Rena, Dahlia Client, PA-C  pravastatin (PRAVACHOL) 40 MG tablet Take 1 tablet (40 mg total) by mouth every evening. 10/01/17   Jake Bathe, MD  rizatriptan (MAXALT-MLT) 10 MG disintegrating tablet Take 10 mg by mouth as needed for migraine (Take 1 pill at headache onset and 1 pill two  hours later if any headache remains. Maximum 2 pills in twenty-four hours as directed). May repeat in 2 hours if needed    [provider]  sertraline (ZOLOFT) 100 MG tablet Take 100 mg by mouth daily.  12/11/13   [provider]  ValACYclovir HCl (VALTREX PO) Take 400 mg by mouth.    [provider]  VIVELLE-DOT 0.1 MG/24HR patch Place 1 patch onto the skin 2 (two) times a week.  05/16/15   [provider]    Family History Family History  Problem Relation Age of Onset  . Thyroid disease Mother   . Hypertension Mother   . Dementia Mother   . Hypertension Father   . Hyperlipidemia Father   . Cancer - Prostate Father   . Cancer - Other Maternal Grandmother   . Cancer - Other Paternal Grandmother   . Hypertension Paternal Grandfather     Social History Social History   Tobacco Use  . Smoking status: Current Some Day Smoker    Packs/day: 0.25    Years: 15.00    Pack years: 3.75    Types: Cigarettes  . Smokeless tobacco: Never Used  Substance Use Topics  . Alcohol use: Yes    Comment: Pt reports a few glasses of wine a night  . Drug use: No     Allergies   Dexilant [dexlansoprazole]; Crestor [rosuvastatin]; Lipitor [atorvastatin]; Sulfamethoxazole-trimethoprim; Augmentin [amoxicillin-pot clavulanate]; and Cipro hc [ciprofloxacin-hydrocortisone]   Review of Systems Review of Systems  Constitutional: Negative for appetite change, diaphoresis, fatigue, fever and unexpected weight change.  HENT: Negative for mouth sores.        Hit head  Eyes: Negative for visual disturbance.  Respiratory: Negative for cough, chest tightness, shortness of breath and wheezing.   Cardiovascular: Negative for chest pain.  Gastrointestinal: Negative for abdominal pain, constipation, diarrhea, nausea and vomiting.  Endocrine: Negative for polydipsia, polyphagia and polyuria.  Genitourinary: Negative for dysuria, frequency, hematuria and urgency.    Musculoskeletal: Positive for arthralgias, gait problem and joint swelling. Negative for back pain and neck stiffness.  Skin: Positive for color change. Negative for rash.  Allergic/Immunologic: Negative for immunocompromised state.  Neurological: Negative for syncope, light-headedness and headaches.  Hematological: Does not bruise/bleed easily.  Psychiatric/Behavioral: Negative for sleep disturbance. The patient is not nervous/anxious.      Physical Exam Updated Vital Signs BP (!) 171/120 (BP Location: Right Arm)   Pulse 67   Temp 98.4 F (36.9 C) (Oral)   Resp 18   Ht 5\' 8"  (1.727 m)   Wt 70.3 kg   SpO2 99%   BMI 23.57 kg/m   Physical Exam  Constitutional: She appears  well-developed and well-nourished. No distress.  Awake, alert, nontoxic appearance  HENT:  Head: Normocephalic. Head is with abrasion and with contusion. Head is without raccoon's eyes, without Battle's sign and without laceration.    Mouth/Throat: Oropharynx is clear and moist. No oropharyngeal exudate.  No Malocclusion No battle signs No Racoon eyes  Eyes: Pupils are equal, round, and reactive to light. EOM are normal. Right conjunctiva is injected. Right conjunctiva has no hemorrhage. Left conjunctiva is injected. Left conjunctiva has no hemorrhage. No scleral icterus.  Neck: Normal range of motion and full passive range of motion without pain. Neck supple. No tracheal tenderness, no spinous process tenderness and no muscular tenderness present. Normal range of motion present.  No Midline or paraspinal tenderness to palpation. Range of motion without pain.  Cardiovascular: Normal rate, regular rhythm and intact distal pulses.  Pulses:      Dorsalis pedis pulses are 2+ on the right side, and 2+ on the left side.  Capillary refill less than 3 seconds in the bilateral lower extremities.  Pulmonary/Chest: Effort normal and breath sounds normal. No respiratory distress. She has no wheezes.  Equal chest  expansion  Abdominal: Soft. Bowel sounds are normal. She exhibits no mass. There is no tenderness. There is no rebound and no guarding.  Musculoskeletal: She exhibits no edema.       Left hip: Normal.       Left knee: Normal.       Left ankle: She exhibits decreased range of motion, swelling, ecchymosis and deformity. Tenderness.  Full range of motion of the left toes.  Unable to range the left ankle.  Full range of motion of the left knee without pain.  Soft tissues of the lower leg compartments are soft.  No tenderness to palpation to the proximal tibia or fibula.  No tenting of the skin.  Neurological: She is alert.  Speech is clear and goal oriented Moves extremities without ataxia  Skin: Skin is warm and dry. She is not diaphoretic.  Psychiatric: She has a normal mood and affect.  Nursing note and vitals reviewed.    ED Treatments / Results   Radiology Dg Ankle Complete Left  Result Date: 08/26/2018 CLINICAL DATA:  Fall.  Ankle deformity. EXAM: LEFT ANKLE COMPLETE - 3+ VIEW COMPARISON:  None. FINDINGS: There is a transverse fracture noted at the base of the medial malleolus and oblique fracture through the distal fibula. Probable posterior malleolar fracture as well. The medial malleolar and fibular fractures are mildly displaced. No subluxation or dislocation. Diffuse soft tissue swelling. IMPRESSION: Trimalleolar fracture. Electronically Signed   By: Charlett Nose M.D.   On: 08/26/2018 22:44   Ct Head Wo Contrast  Result Date: 08/26/2018 CLINICAL DATA:  Patient tripped and fell on deck hitting head. EXAM: CT HEAD WITHOUT CONTRAST CT CERVICAL SPINE WITHOUT CONTRAST TECHNIQUE: Multidetector CT imaging of the head and cervical spine was performed following the standard protocol without intravenous contrast. Multiplanar CT image reconstructions of the cervical spine were also generated. COMPARISON:  None. Prior 08/15/2003 CT was of the sinuses. FINDINGS: CT HEAD FINDINGS Brain: Mild age  related involutional changes of the brain with minimal small vessel ischemic disease. No acute intracranial hemorrhage, midline shift or edema. No large vascular territory infarct. No hydrocephalus. Midline fourth ventricle and basal cisterns without effacement. The brainstem and cerebellum are nonacute. No extra-axial fluid collections nor intra-axial mass. Vascular: No hyperdense vessel or unexpected calcification. Skull: Normal. Negative for fracture or focal lesion. Sinuses/Orbits: No acute  finding. Bilateral lens replacements. Intact orbits and globes. No retrobulbar hemorrhage or hematoma. Clear paranasal sinuses. Other: None. CT CERVICAL SPINE FINDINGS Alignment: Normal. Skull base and vertebrae: No acute fracture. No primary bone lesion or focal pathologic process. Soft tissues and spinal canal: No prevertebral fluid or swelling. No visible canal hematoma. Disc levels: Slight disc space narrowing C5-6. No significant central or foraminal stenosis. No jumped or perched facets. C5-6 uncovertebral joint osteoarthritis on the right with uncinate spurring contribute to slight foraminal encroachment. Upper chest: Clear Other: Bilateral extracranial carotid arteriosclerosis. IMPRESSION: 1. No acute intracranial abnormality. 2. No acute posttraumatic cervical spine fracture or subluxation. Electronically Signed   By: Tollie Eth M.D.   On: 08/26/2018 23:28   Ct Cervical Spine Wo Contrast  Result Date: 08/26/2018 CLINICAL DATA:  Patient tripped and fell on deck hitting head. EXAM: CT HEAD WITHOUT CONTRAST CT CERVICAL SPINE WITHOUT CONTRAST TECHNIQUE: Multidetector CT imaging of the head and cervical spine was performed following the standard protocol without intravenous contrast. Multiplanar CT image reconstructions of the cervical spine were also generated. COMPARISON:  None. Prior 08/15/2003 CT was of the sinuses. FINDINGS: CT HEAD FINDINGS Brain: Mild age related involutional changes of the brain with minimal  small vessel ischemic disease. No acute intracranial hemorrhage, midline shift or edema. No large vascular territory infarct. No hydrocephalus. Midline fourth ventricle and basal cisterns without effacement. The brainstem and cerebellum are nonacute. No extra-axial fluid collections nor intra-axial mass. Vascular: No hyperdense vessel or unexpected calcification. Skull: Normal. Negative for fracture or focal lesion. Sinuses/Orbits: No acute finding. Bilateral lens replacements. Intact orbits and globes. No retrobulbar hemorrhage or hematoma. Clear paranasal sinuses. Other: None. CT CERVICAL SPINE FINDINGS Alignment: Normal. Skull base and vertebrae: No acute fracture. No primary bone lesion or focal pathologic process. Soft tissues and spinal canal: No prevertebral fluid or swelling. No visible canal hematoma. Disc levels: Slight disc space narrowing C5-6. No significant central or foraminal stenosis. No jumped or perched facets. C5-6 uncovertebral joint osteoarthritis on the right with uncinate spurring contribute to slight foraminal encroachment. Upper chest: Clear Other: Bilateral extracranial carotid arteriosclerosis. IMPRESSION: 1. No acute intracranial abnormality. 2. No acute posttraumatic cervical spine fracture or subluxation. Electronically Signed   By: Tollie Eth M.D.   On: 08/26/2018 23:28   Dg Foot Complete Left  Result Date: 08/26/2018 CLINICAL DATA:  Fall.  Deformity of left ankle. EXAM: LEFT FOOT - COMPLETE 3+ VIEW COMPARISON:  Ankle series performed today. FINDINGS: The trimalleolar fracture at the left ankle again noted as seen on ankle series. No additional fracture within the left foot. Joint spaces maintained. Soft tissues intact. IMPRESSION: Trimalleolar left ankle fracture. Electronically Signed   By: Charlett Nose M.D.   On: 08/26/2018 22:45    Procedures .Splint Application Date/Time: 08/26/2018 11:35 PM Performed by: Dierdre Forth, PA-C Authorized by: Dierdre Forth,  PA-C   Consent:    Consent obtained:  Verbal   Consent given by:  Patient   Risks discussed:  Discoloration, numbness, pain and swelling   Alternatives discussed:  No treatment Pre-procedure details:    Sensation:  Normal   Skin color:  Flesh Procedure details:    Laterality:  Left   Location:  Ankle   Ankle:  L ankle   Splint type:  Short leg   Supplies:  Ortho-Glass Post-procedure details:    Pain:  Unchanged   Sensation:  Normal   Skin color:  Flesh   Patient tolerance of procedure:  Tolerated well, no  immediate complications   (including critical care time)  Medications Ordered in ED Medications  ondansetron (ZOFRAN-ODT) disintegrating tablet 4 mg (4 mg Oral Given 08/26/18 2206)  fentaNYL (SUBLIMAZE) injection 50 mcg (50 mcg Intravenous Given 08/26/18 2355)  fentaNYL (SUBLIMAZE) injection 50 mcg (50 mcg Intravenous Given 08/26/18 2309)  acetaminophen (TYLENOL) tablet 1,000 mg (1,000 mg Oral Given 08/27/18 0105)  oxyCODONE (Oxy IR/ROXICODONE) immediate release tablet 10 mg (10 mg Oral Given 08/27/18 0105)     Initial Impression / Assessment and Plan / ED Course  I have reviewed the triage vital signs and the nursing notes.  Pertinent labs & imaging results that were available during my care of the patient were reviewed by me and considered in my medical decision making (see chart for details).  Clinical Course as of Aug 27 105  Thu Aug 26, 2018  2324 Initially hypertensive  BP(!): 171/120 [HM]  2324 Improved after pain control  BP: 138/64 [HM]  Fri Aug 27, 2018  0052 Pt has sobered.  Long discussion about crutches vs walker usage.  Pt given crutches and education here in the ED.  Pt and family informed that she is to be NON WEIGHTBEARING.  They state understanding.     [HM]  0103 [place] Narcotic Database was accessed.  In the last 12 months, pt has had regular prescriptions for alprazolam by the same physician.  She has had 2 narcotic prescriptions in the last year.         [HM]  0104 Long discussion with patient about safe narcotic usage including not combining this medication with alcohol or her alprazolam.  Patient and family state understanding.   [HM]    Clinical Course User Index [HM] Destenee Guerry, Dahlia ClientHannah, [place]PA-C   Patient presents somewhat intoxicated after mechanical fall.  She did hit her head.  No loss of consciousness.  CT scan of her head and neck are without acute intracranial abnormality or hemorrhage.  I personally evaluated these images.  T-spine is without subluxation.  She has full range of motion without pain.  Plain films of the left ankle show nondisplaced trimalleolar fracture.  She has good pulses and capillary refill.  Her sensation is intact distally.  Will splint and have patient follow with orthopedics.  [facility]  Suspect patient will be able to use crutches without difficulty when she sobers.  We will continue to monitor.  12:55 AM Patient given oral pain control.  She is moving about with her crutches.  She will be discharged home.  She will need close follow-up with [facility].  She is to call their office in the morning to set up follow-up appointment.  Patient and family state understanding and are in agreement with the plan.  Repeat exam, patient remains without proximal fibula or tibia pain.  Her compartments remain soft.  Splint is intact with good capillary refill, motor and sensation.  Her pain is improved.  Discussed reasons to return immediately to the emergency department including numbness, tingling or worsening pain.  Patient and family state understanding.  Final Clinical Impressions(s) / ED Diagnoses   Final diagnoses:  Fall from slip, trip, or stumble, initial encounter  Trimalleolar fracture, left, closed, initial encounter  Contusion of scalp, initial encounter    ED Discharge Orders         Ordered    oxyCODONE (ROXICODONE) 5 MG immediate release tablet  Every 6 hours PRN      08/27/18 0104  Ravynn Hogate, Boyd Kerbs 08/27/18 0106    Nira Conn, MD 08/27/18 [phone]

## 2018-08-27 ENCOUNTER — Ambulatory Visit
Admission: RE | Admit: 2018-08-27 | Discharge: 2018-08-27 | Disposition: A | Discharge status: Home / Self Care | Payer: 59 | Source: Ambulatory Visit | Attending: Orthopedic Surgery | Admitting: Orthopedic Surgery

## 2018-08-27 ENCOUNTER — Other Ambulatory Visit: Payer: Self-pay | Admitting: Orthopedic Surgery

## 2018-08-27 DIAGNOSIS — S82852A Displaced trimalleolar fracture of left lower leg, initial encounter for closed fracture: Secondary | ICD-10-CM | POA: Diagnosis not present

## 2018-08-27 DIAGNOSIS — S82855A Nondisplaced trimalleolar fracture of left lower leg, initial encounter for closed fracture: Secondary | ICD-10-CM | POA: Diagnosis not present

## 2018-08-27 DIAGNOSIS — M25572 Pain in left ankle and joints of left foot: Secondary | ICD-10-CM

## 2018-08-27 MED ORDER — OXYCODONE HCL 5 MG PO TABS
10.0000 mg | ORAL_TABLET | Freq: Once | ORAL | Status: AC
  Administered 2018-08-27: 10 mg via ORAL
  Filled 2018-08-27: qty 2

## 2018-08-27 MED ORDER — ACETAMINOPHEN 500 MG PO TABS
1000.0000 mg | ORAL_TABLET | Freq: Once | ORAL | Status: AC
  Administered 2018-08-27: 1000 mg via ORAL
  Filled 2018-08-27: qty 2

## 2018-08-27 MED ORDER — OXYCODONE HCL 5 MG PO TABS: 5.0000 mg | ORAL_TABLET | Freq: Four times a day (QID) | ORAL | 0 refills | Status: DC | PRN

## 2018-08-27 NOTE — Progress Notes (Signed)
Orthopedic Tech Progress Note Patient Details:  Darlene LopesKae I Saunders 02/05/1956 [id number]  Ortho Devices Type of Ortho Device: Short leg splint, Stirrup splint Ortho Device/Splint Interventions: Ordered, Application   Post Interventions Patient Tolerated: Fair Instructions Provided: Adjustment of device, Care of device   Norva KarvonenWalls, Nikkol Pai T 08/27/2018, 12:21 AM

## 2018-08-27 NOTE — Discharge Instructions (Addendum)
1. Medications: Tylenol 1000mg  q6 hrs and oxycodone q6hrs as needed for breakthrough pain, usual home medications 2. Treatment: rest, ice, elevate and keep cast clean and dry, drink plenty of fluids, No weight bearing on the left leg 3. Follow Up: Please followup with orthopedics this week for discussion of your diagnoses and further evaluation after today's visit; Please return to the ER for worsening symptoms or other concerns

## 2018-08-27 NOTE — ED Notes (Signed)
Pt refusing crutches.  

## 2018-08-30 DIAGNOSIS — G8918 Other acute postprocedural pain: Secondary | ICD-10-CM | POA: Diagnosis not present

## 2018-08-30 DIAGNOSIS — S93492A Sprain of other ligament of left ankle, initial encounter: Secondary | ICD-10-CM | POA: Diagnosis not present

## 2018-08-30 DIAGNOSIS — S82852A Displaced trimalleolar fracture of left lower leg, initial encounter for closed fracture: Secondary | ICD-10-CM | POA: Diagnosis not present

## 2018-08-30 DIAGNOSIS — Y999 Unspecified external cause status: Secondary | ICD-10-CM | POA: Diagnosis not present

## 2018-08-30 DIAGNOSIS — S8362XA Sprain of the superior tibiofibular joint and ligament, left knee, initial encounter: Secondary | ICD-10-CM | POA: Diagnosis not present

## 2018-08-31 ENCOUNTER — Other Ambulatory Visit: Payer: 59

## 2018-09-06 ENCOUNTER — Other Ambulatory Visit: Payer: Self-pay | Admitting: Cardiology

## 2018-09-13 DIAGNOSIS — S82852D Displaced trimalleolar fracture of left lower leg, subsequent encounter for closed fracture with routine healing: Secondary | ICD-10-CM | POA: Diagnosis not present

## 2018-09-16 ENCOUNTER — Encounter: Payer: Self-pay | Admitting: Cardiology

## 2018-09-28 IMAGING — MG STEREOTACTIC CORE NEEDLE BIOPSY
8 of 9 series · 8 of 13 positions shown · non-contrast
Comparison: Previous exams.

ADDENDUM:
Pathology revealed FIBROADENOMATOID NODULE WITH CALCIFICATIONS of
the Left breast, upper outer. This was found to be concordant by Dr.
Amnon Tiger. Pathology results were discussed with the patient by
telephone. The patient reported doing well after the biopsy with
tenderness at the site. Post biopsy instructions and care were
reviewed and questions were answered. The patient was encouraged to
call The [REDACTED] for any additional
concerns. The patient was instructed to return for annual screening

Pathology results reported by Rajaguru Mamah, RN on 10/15/2017.
CLINICAL DATA: 62-year-old female for tissue sampling of
calcifications within the upper-outer left breast.
EXAM:
LEFT BREAST STEREOTACTIC CORE NEEDLE BIOPSY

[L (1 of 5)]
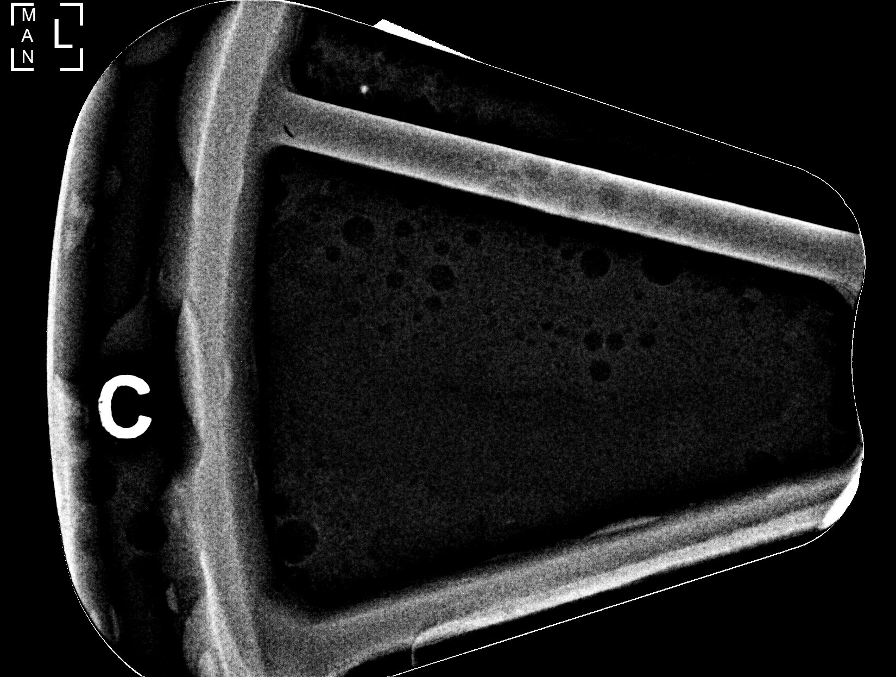

[L (2 of 5)]
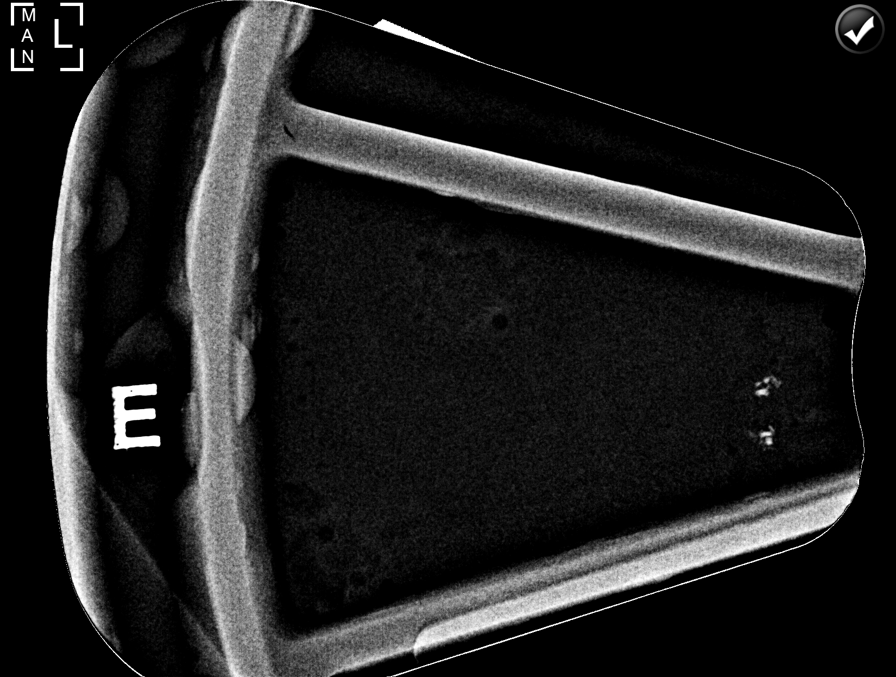

[L (3 of 5)]
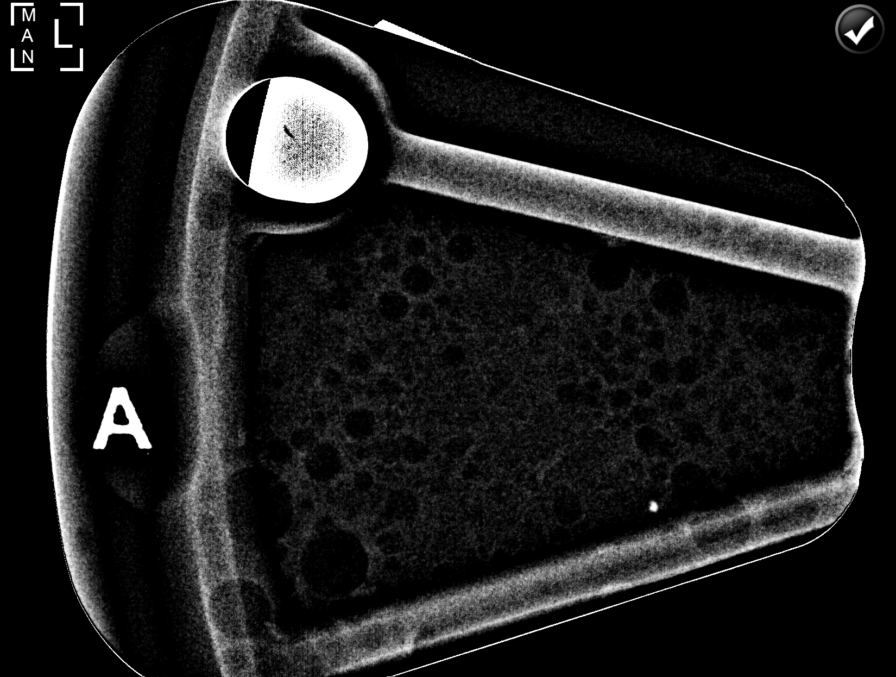

[L (4 of 5)]
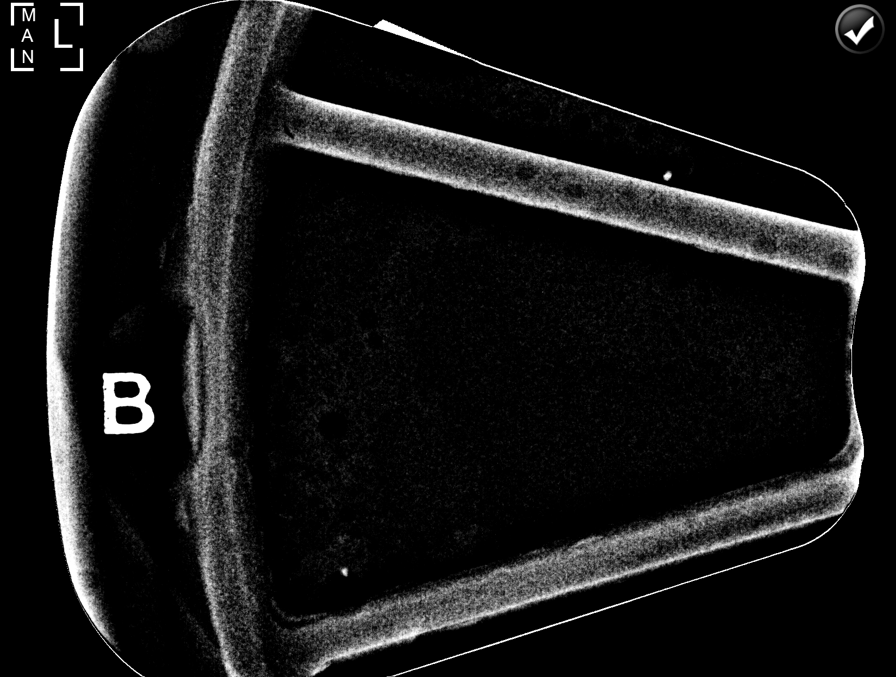

[L (5 of 5)]
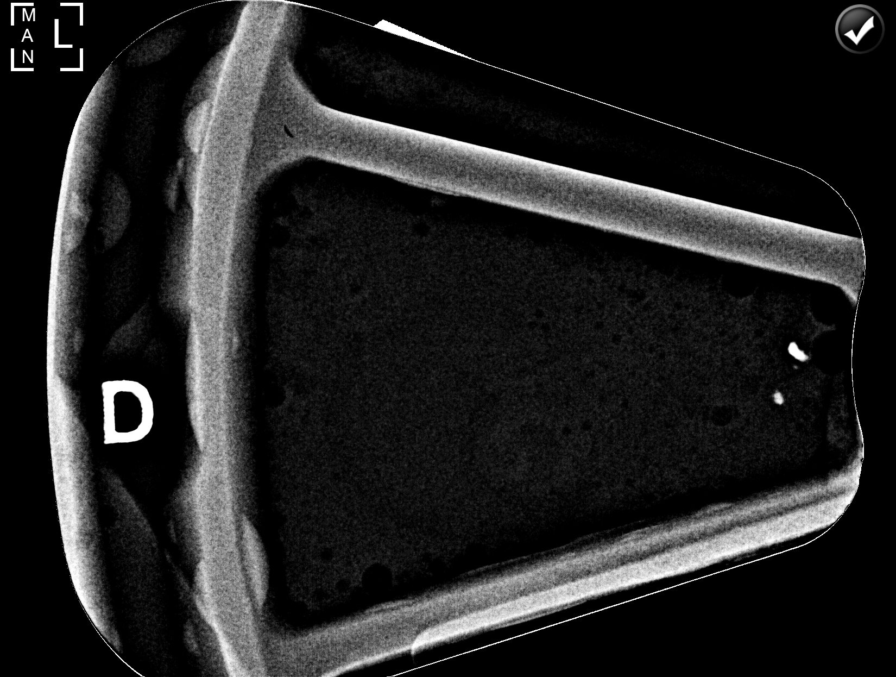

[L CC (1 of 3)]
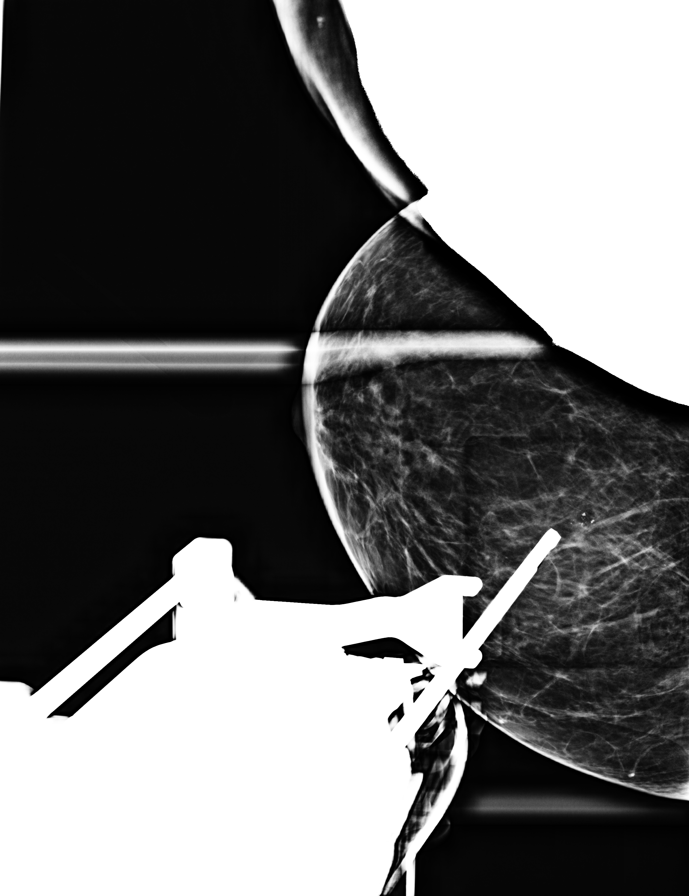

[L CC (2 of 3)]
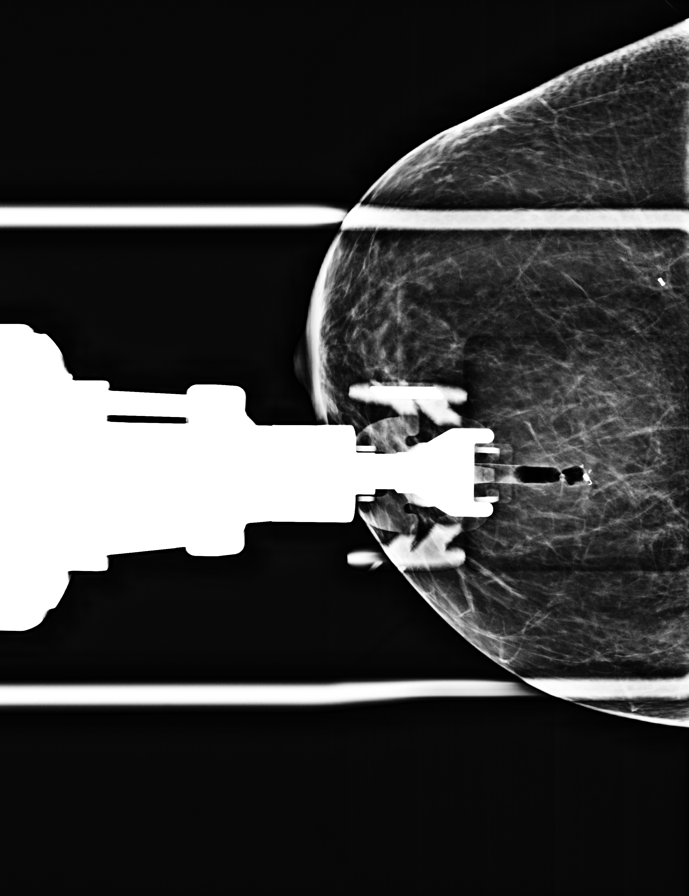

[L CC (3 of 3)]
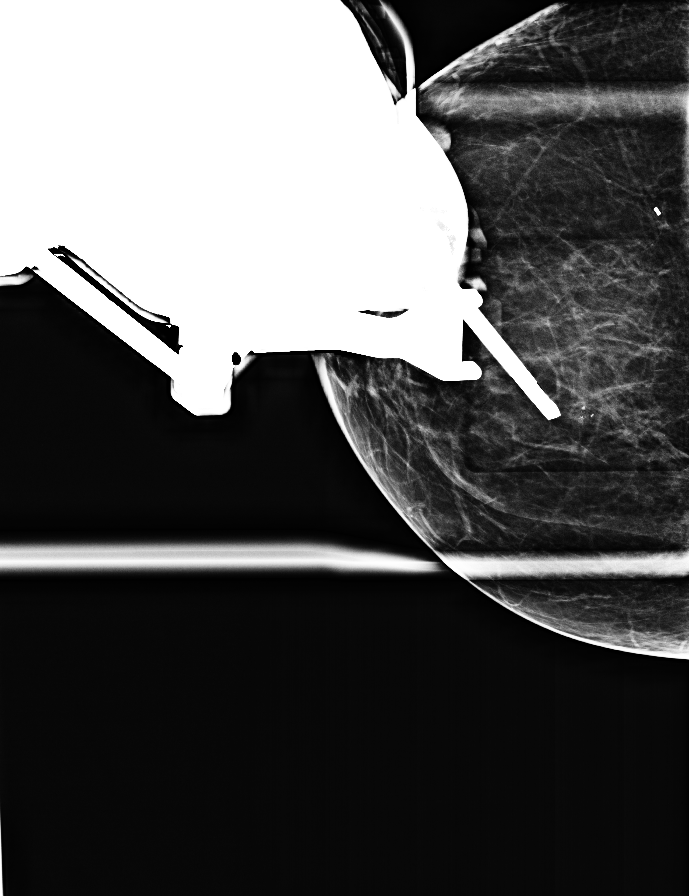

[8 of 13 positions shown; findings below may reference images not displayed]



Using sterile technique and 1% Lidocaine as local anesthetic, under
stereotactic guidance, a 9 gauge vacuum assisted device was used to
perform core needle biopsy of calcifications within the upper-outer
quadrant of the left breast using a superior approach. Specimen
radiograph was performed showing calcifications. Specimens with
calcifications are identified for pathology.

Lesion quadrant: Upper-outer left breast

At the conclusion of the procedure, a coil shaped tissue marker clip
was deployed into the biopsy cavity. Follow-up 2-view mammogram was
performed and dictated separately.
IMPRESSION: Stereotactic-guided biopsy of upper-outer left breast
calcifications. No apparent complications.

## 2018-10-13 ENCOUNTER — Ambulatory Visit: Payer: 59 | Admitting: Cardiology

## 2018-10-22 DIAGNOSIS — S82852D Displaced trimalleolar fracture of left lower leg, subsequent encounter for closed fracture with routine healing: Secondary | ICD-10-CM | POA: Diagnosis not present

## 2018-10-30 ENCOUNTER — Other Ambulatory Visit: Payer: Self-pay | Admitting: Physician Assistant

## 2018-10-30 DIAGNOSIS — T8130XA Disruption of wound, unspecified, initial encounter: Secondary | ICD-10-CM | POA: Diagnosis not present

## 2018-11-10 DIAGNOSIS — H90A12 Conductive hearing loss, unilateral, left ear with restricted hearing on the contralateral side: Secondary | ICD-10-CM | POA: Diagnosis not present

## 2018-11-10 DIAGNOSIS — H90A21 Sensorineural hearing loss, unilateral, right ear, with restricted hearing on the contralateral side: Secondary | ICD-10-CM | POA: Diagnosis not present

## 2018-11-10 DIAGNOSIS — H6502 Acute serous otitis media, left ear: Secondary | ICD-10-CM | POA: Diagnosis not present

## 2018-11-10 DIAGNOSIS — H6992 Unspecified Eustachian tube disorder, left ear: Secondary | ICD-10-CM | POA: Diagnosis not present

## 2018-11-10 DIAGNOSIS — M25572 Pain in left ankle and joints of left foot: Secondary | ICD-10-CM | POA: Diagnosis not present

## 2018-11-12 DIAGNOSIS — H6992 Unspecified Eustachian tube disorder, left ear: Secondary | ICD-10-CM | POA: Insufficient documentation

## 2018-11-12 DIAGNOSIS — H6502 Acute serous otitis media, left ear: Secondary | ICD-10-CM | POA: Insufficient documentation

## 2018-11-19 DIAGNOSIS — T8130XD Disruption of wound, unspecified, subsequent encounter: Secondary | ICD-10-CM | POA: Diagnosis not present

## 2018-11-25 ENCOUNTER — Encounter: Payer: Self-pay | Admitting: Cardiology

## 2018-11-25 ENCOUNTER — Ambulatory Visit: Payer: 59 | Admitting: Cardiology

## 2018-11-25 VITALS — BP 140/80 | HR 60 | Ht 68.0 in | Wt 152.4 lb

## 2018-11-25 DIAGNOSIS — F17200 Nicotine dependence, unspecified, uncomplicated: Secondary | ICD-10-CM

## 2018-11-25 DIAGNOSIS — E782 Mixed hyperlipidemia: Secondary | ICD-10-CM | POA: Diagnosis not present

## 2018-11-25 DIAGNOSIS — Z8249 Family history of ischemic heart disease and other diseases of the circulatory system: Secondary | ICD-10-CM

## 2018-11-25 NOTE — Progress Notes (Signed)
Cardiology Office Note   Date:  11/25/2018   ID:  DORLA MAKI, DOB [birth year], MRN [id number]  PCP:  Merri Brunette, MD  Cardiologist:   Donato Schultz, MD       History of Present Illness: Darlene Saunders is a 63 y.o. female here for evaluation of hyperlipidemia. She was last seen by me on 18/2012. She is a history of back pain, chronic medications, migraines previously treated at headache center. Triglycerides at one point were 305, LDL 134, HDL 21. Baseline triglycerides have been as high as the 1900 range.  She used to see [name] had [facility] years ago. 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 No prior evidence of pancreatitis. 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  In her 40's started having cataracts. She was taking the statins at the time  She denies any exertional anginal symptoms, no syncope, no bleeding, no orthopnea, no PND.  She's had quite a few statin intolerances. She also continues to smoke.  She does drink alcohol, her AST has been elevated in the past. Only drinking wine on weekends.   She lost 15 pounds after hemoglobin A1c was 6.6 and 02/16/2015.. Right leg pain on Livalo.   10/01/17-no syncope bleeding orthopnea PND.  Occasional GERD, abdominal discomfort.  Family history of AAA.  Still smoking but trying to quit.  No chest pain.  Hemoglobin A1c went back up after gaining back 15 pounds.  Trying to alter diet.  11/25/18 - 08/27/18 - fell, left foot surgery. GI saw spasm, feels better. Retired in June.  Overall been doing quite well.  No chest pain fevers chills nausea vomiting syncope.  Saw Dr. Randie Heinz for evaluation of mesenteric ischemia, duplex overall reassuring.    Past Medical History:  Diagnosis Date  . Complete hearing loss of right ear   . Diabetes mellitus without complication (HCC)    Type 2   . Esophageal reflux   . Hyperlipidemia   . Hypertension   . Migraine     Past Surgical History:  Procedure  Laterality Date  . APPENDECTOMY    . hysterectomy, removal of right ovary    . LAPAROTOMY    . ORTHOPEDIC SURGERY       Current Outpatient Medications  Medication Sig Dispense Refill  . ALPRAZolam (XANAX) 0.5 MG tablet Take 0.5 mg by mouth at bedtime.     Marland Kitchen atenolol (TENORMIN) 100 MG tablet Take 100 mg by mouth daily.     . Biotin 5000 MCG CAPS Take 1 capsule by mouth daily.    . cyclobenzaprine (FLEXERIL) 10 MG tablet Take 10 mg by mouth at bedtime.     Marland Kitchen esomeprazole (NEXIUM) 40 MG capsule Take 40 mg by mouth daily at 12 noon.    . fenofibrate 160 MG tablet Take 1 tablet (160 mg total) by mouth daily. Please keep upcoming appt in January with Dr. Anne Fu for future refills. Thank you 90 tablet 0  . fexofenadine (ALLEGRA) 180 MG tablet Take 180 mg by mouth as needed for allergies or rhinitis.    . indapamide (LOZOL) 2.5 MG tablet Take 2.5 mg by mouth daily.     Marland Kitchen levothyroxine (SYNTHROID, LEVOTHROID) 50 MCG tablet Take 50 mcg by mouth daily before breakfast.     . metFORMIN (GLUCOPHAGE) 500 MG tablet Take by mouth 2 (two) times daily with a meal.    . metoCLOPramide (REGLAN) 10 MG tablet Take 10 mg by mouth as  needed for nausea (Take one tablet every 4 to 6 hours with migraine headaches as directed for her migrains).    . Omega-3 Fatty Acids (FISH OIL) 1000 MG CAPS Take 3 capsules by mouth 2 (two) times daily.    Marland Kitchen oxyCODONE (ROXICODONE) 5 MG immediate release tablet Take 1 tablet (5 mg total) by mouth every 6 (six) hours as needed for severe pain. 20 tablet 0  . pravastatin (PRAVACHOL) 40 MG tablet Take 1 tablet (40 mg total) by mouth every evening. Please keep upcoming appt in January with Dr. Anne Fu for future refills. Thank you 90 tablet 0  . PREMARIN 1.25 MG tablet Take 1.25 mg by mouth daily.    . sertraline (ZOLOFT) 100 MG tablet Take 100 mg by mouth daily.     . ValACYclovir HCl (VALTREX PO) Take 400 mg by mouth.     No current facility-administered medications for this visit.      Allergies:   Dexilant [dexlansoprazole]; Crestor [rosuvastatin]; Lipitor [atorvastatin]; Penicillins; Sulfamethoxazole-trimethoprim; Augmentin [amoxicillin-pot clavulanate]; and Cipro hc [ciprofloxacin-hydrocortisone]    Social History:  The patient  reports that she has been smoking cigarettes. She has a 3.75 pack-year smoking history. She has never used smokeless tobacco. She reports current alcohol use. She reports that she does not use drugs.   Family History:  The patient's family history includes Cancer - Other in her maternal grandmother and paternal grandmother; Cancer - Prostate in her father; Dementia in her mother; Hyperlipidemia in her father; Hypertension in her father, mother, and paternal grandfather; Thyroid disease in her mother.    ROS:  Please see the history of present illness.     PHYSICAL EXAM: VS:  BP 140/80   Pulse 60   Ht 5\' 8"  (1.727 m)   Wt 152 lb 6.4 oz (69.1 kg)   SpO2 96%   BMI 23.17 kg/m  , BMI Body mass index is 23.17 kg/m. GEN: Well nourished, well developed, in no acute distress  HEENT: normal  Neck: no JVD, carotid bruits, or masses Cardiac: RRR; no murmurs, rubs, or gallops,no edema  Respiratory:  clear to auscultation bilaterally, normal work of breathing GI: soft, nontender, nondistended, + BS MS: no deformity or atrophy left leg, break, using walker Skin: warm and dry, no rash Neuro:  Alert and Oriented x 3, Strength and sensation are intact Psych: euthymic mood, full affect    EKG:  EKG is ordered today. The ekg ordered today demonstrates 11/22/2018-sinus rhythm 60 borderline LVH 10/01/17 - NSR normal, personally viewed.06/16/16-sinus rhythm, 62 no other significant abnormality is. 06/14/15-sinus bradycardia rate 56 with no other abnormalities.   Recent Labs: No results found for requested labs within last 8760 hours.    Lipid Panel    Component Value Date/Time   CHOL 211 (H) 06/16/2016 0839   CHOL 239 (H) 06/14/2015 0936   TRIG  339 (H) 06/16/2016 0839   TRIG 304 (H) 06/14/2015 0936   TRIG 923 (HH) 10/05/2006 0825   HDL 14 (L) 06/16/2016 0839   HDL 18 (L) 06/14/2015 0936   CHOLHDL 15.1 (H) 06/16/2016 0839   VLDL 68 (H) 06/16/2016 0839   LDLCALC 129 06/16/2016 0839   LDLCALC 160 (H) 06/14/2015 0936   LDLDIRECT 91.2 02/02/2009 0821      Wt Readings from Last 3 Encounters:  11/25/18 152 lb 6.4 oz (69.1 kg)  08/26/18 155 lb (70.3 kg)  03/12/18 159 lb (72.1 kg)      Other studies Reviewed: Additional studies/ records that were reviewed  today include: Prior lab work, EKG, office notes. Review of the above records demonstrates: As above  Labs: 05/2015: LDL- P number 3124, TC 239, LDL 160, TG 304, HDL 18, CK 178 (Fenofibrate ) 06/2014: LDL-P number 2379, TC 167, LDL 97, TG 276, HDL 15.  LFTs are slightly elevated.  This is different from 6 months ago but similar to 5 years ago (no records available in between) 12/2013:  LDL-P number 2392, TC 183, LDL 106, TG 264, HDL 24 (Fenofibrate 160 mg qd, fish oil 6 g/d, Livalo 2 mg qd)  CK - normal   ASSESSMENT AND PLAN:  Mixed hyperlipidemia -Fish oil, fenofibrate, pravastatin -Triglycerides 339.  Originally they were 1900. -Diet modification as well -We will go ahead and check a lipid panel today  Diabetes mellitus - Last hemoglobin A1c 6.4 from 7.6.  She states that there is no diabetes in her family.  GERD/intermittent abdominal discomfort/family history of AAA/years of smoking. -Mesenteric duplex by Dr. Randie Heinz did not corroborate with CT scan findings.  No further mesenteric ischemic symptoms since she has retired.  GI evaluated and look like stress was causing spasm.  Tobacco use -Continue with to encourage cessation, 2-3 per day. Patches are ready.    Disposition:   She will follow-up with me in one year.   Signed, Donato Schultz, MD  11/25/2018 4:59 PM    [facility] [address] [place], [place], [place]  [address] Phone: [phone]; Fax: [phone]

## 2018-11-25 NOTE — Patient Instructions (Signed)
Medication Instructions:  The current medical regimen is effective;  continue present plan and medications.  If you need a refill on your cardiac medications before your next appointment, please call your pharmacy.   Lab work: Please return fasting for Lipid Panel.  If you have labs (blood work) drawn today and your tests are completely normal, you will receive your results only by: Marland Kitchen MyChart Message (if you have MyChart) OR . A paper copy in the mail If you have any lab test that is abnormal or we need to change your treatment, we will call you to review the results.  Follow-Up: At [facility], you and your health needs are our priority.  As part of our continuing mission to provide you with exceptional heart care, we have created designated Provider Care Teams.  These Care Teams include your primary Cardiologist (physician) and Advanced Practice Providers (APPs -  Physician Assistants and Nurse Practitioners) who all work together to provide you with the care you need, when you need it. You will need a follow up appointment in 12 months.  Please call our office 2 months in advance to schedule this appointment.  You may see Donato Schultz, MD or one of the following Advanced Practice Providers on your designated Care Team:   Norma Fredrickson, NP Nada Boozer, NP . Georgie Chard, NP  Thank you for choosing [facility]!!

## 2018-12-01 ENCOUNTER — Other Ambulatory Visit: Payer: Self-pay

## 2018-12-01 ENCOUNTER — Other Ambulatory Visit: Payer: 59 | Admitting: *Deleted

## 2018-12-01 DIAGNOSIS — E782 Mixed hyperlipidemia: Secondary | ICD-10-CM | POA: Diagnosis not present

## 2018-12-01 DIAGNOSIS — Z8249 Family history of ischemic heart disease and other diseases of the circulatory system: Secondary | ICD-10-CM | POA: Diagnosis not present

## 2018-12-01 LAB — LIPID PANEL
Chol/HDL Ratio: 11.9 ratio — ABNORMAL HIGH (ref 0.0–4.4)
Cholesterol, Total: 202 mg/dL — ABNORMAL HIGH (ref 100–199)
HDL: 17 mg/dL — ABNORMAL LOW (ref >39)
LDL Calculated: 115 mg/dL — ABNORMAL HIGH (ref 0–99)
Triglycerides: 349 mg/dL — ABNORMAL HIGH (ref 0–149)
VLDL Cholesterol Cal: 70 mg/dL — ABNORMAL HIGH (ref 5–40)

## 2018-12-06 DIAGNOSIS — Z01419 Encounter for gynecological examination (general) (routine) without abnormal findings: Secondary | ICD-10-CM | POA: Diagnosis not present

## 2018-12-06 DIAGNOSIS — Z6824 Body mass index (BMI) 24.0-24.9, adult: Secondary | ICD-10-CM | POA: Diagnosis not present

## 2018-12-10 DIAGNOSIS — T8130XD Disruption of wound, unspecified, subsequent encounter: Secondary | ICD-10-CM | POA: Diagnosis not present

## 2018-12-25 ENCOUNTER — Other Ambulatory Visit: Payer: Self-pay | Admitting: Cardiology

## 2019-10-04 ENCOUNTER — Other Ambulatory Visit: Payer: Self-pay

## 2019-10-04 ENCOUNTER — Ambulatory Visit: Payer: 59 | Admitting: Podiatry

## 2019-10-04 ENCOUNTER — Encounter: Payer: Self-pay | Admitting: Podiatry

## 2019-10-04 VITALS — BP 171/85 | HR 55 | Resp 16

## 2019-10-04 DIAGNOSIS — B351 Tinea unguium: Secondary | ICD-10-CM | POA: Diagnosis not present

## 2019-10-04 DIAGNOSIS — E119 Type 2 diabetes mellitus without complications: Secondary | ICD-10-CM

## 2019-10-04 DIAGNOSIS — M79676 Pain in unspecified toe(s): Secondary | ICD-10-CM

## 2019-10-05 NOTE — Progress Notes (Signed)
Subjective:  Patient ID: Darlene Saunders, female    DOB: 1956/03/19,  MRN: [id number] HPI Chief Complaint  Patient presents with  . Toe Pain    Hallux left - red, swollen, tender x few weeks, noticed after cutting toenail, PCP rx'd cephalexin-some better  . New Patient (Initial Visit)    64 y.o. female presents with the above complaint.   ROS: Denies fever chills nausea vomiting muscle aches pains calf pain back pain chest pain shortness of breath.  Past Medical History:  Diagnosis Date  . Complete hearing loss of right ear   . Diabetes mellitus without complication (HCC)    Type 2   . Esophageal reflux   . Hyperlipidemia   . Hypertension   . Migraine    Past Surgical History:  Procedure Laterality Date  . APPENDECTOMY    . hysterectomy, removal of right ovary    . LAPAROTOMY    . ORTHOPEDIC SURGERY      Current Outpatient Medications:  .  acyclovir (ZOVIRAX) 400 MG tablet, Take 400 mg by mouth 2 (two) times daily., Disp: , Rfl:  .  ALPRAZolam (XANAX) 0.5 MG tablet, Take 0.5 mg by mouth at bedtime. , Disp: , Rfl:  .  atenolol (TENORMIN) 100 MG tablet, Take 100 mg by mouth daily. , Disp: , Rfl:  .  Biotin 5000 MCG CAPS, Take 1 capsule by mouth daily., Disp: , Rfl:  .  BISACODYL 5 MG EC tablet, TK UTD, Disp: , Rfl:  .  cyclobenzaprine (FLEXERIL) 10 MG tablet, Take 10 mg by mouth at bedtime. , Disp: , Rfl:  .  esomeprazole (NEXIUM) 40 MG capsule, Take 40 mg by mouth daily at 12 noon., Disp: , Rfl:  .  fenofibrate 160 MG tablet, Take 1 tablet (160 mg total) by mouth daily., Disp: 90 tablet, Rfl: 3 .  fexofenadine (ALLEGRA) 180 MG tablet, Take 180 mg by mouth as needed for allergies or rhinitis., Disp: , Rfl:  .  indapamide (LOZOL) 2.5 MG tablet, Take 2.5 mg by mouth daily. , Disp: , Rfl:  .  levothyroxine (SYNTHROID, LEVOTHROID) 50 MCG tablet, Take 50 mcg by mouth daily before breakfast. , Disp: , Rfl:  .  metFORMIN (GLUCOPHAGE-XR) 500 MG 24 hr tablet, Take 500 mg by mouth 2  (two) times daily., Disp: , Rfl:  .  metoCLOPramide (REGLAN) 10 MG tablet, Take 10 mg by mouth as needed for nausea (Take one tablet every 4 to 6 hours with migraine headaches as directed for her migrains)., Disp: , Rfl:  .  Omega-3 Fatty Acids (FISH OIL) 1000 MG CAPS, Take 3 capsules by mouth 2 (two) times daily., Disp: , Rfl:  .  pravastatin (PRAVACHOL) 40 MG tablet, Take 1 tablet (40 mg total) by mouth daily., Disp: 90 tablet, Rfl: 3 .  PREMARIN 1.25 MG tablet, Take 1.25 mg by mouth daily., Disp: , Rfl:  .  ramipril (ALTACE) 5 MG capsule, Take 5 mg by mouth daily., Disp: , Rfl:  .  sertraline (ZOLOFT) 100 MG tablet, Take 100 mg by mouth daily. , Disp: , Rfl:   Allergies  Allergen Reactions  . Dexilant [Dexlansoprazole] Diarrhea  . Crestor [Rosuvastatin]     Muscle aches  . Lipitor [Atorvastatin]     Muscle aches  . Penicillins   . Sulfamethoxazole-Trimethoprim Hives  . Augmentin [Amoxicillin-Pot Clavulanate] Hives  . Cipro Hc [Ciprofloxacin-Hydrocortisone]     Topical reaction   Review of Systems Objective:   Vitals:   10/04/19 1558  BP: (!) 171/85  Pulse: (!) 55  Resp: 16    General: Well developed, nourished, in no acute distress, alert and oriented x3   Dermatological: Skin is warm, dry and supple bilateral. Nails x 10 are well maintained; remaining integument appears unremarkable at this time. There are no open sores, no preulcerative lesions, no rash or signs of infection present.  Hallux left does demonstrate sharp incurvated nail margins of the tips with mild erythema and tenderness.   Vascular: Dorsalis Pedis artery and Posterior Tibial artery pedal pulses are 2/4 bilateral with immedate capillary fill time. Pedal hair growth present. No varicosities and no lower extremity edema present bilateral.   Neruologic: Grossly intact via light touch bilateral. Vibratory intact via tuning fork bilateral. Protective threshold with Semmes Wienstein monofilament intact to all  pedal sites bilateral. Patellar and Achilles deep tendon reflexes 2+ bilateral. No Babinski or clonus noted bilateral.   Musculoskeletal: No gross boney pedal deformities bilateral. No pain, crepitus, or limitation noted with foot and ankle range of motion bilateral. Muscular strength 5/5 in all groups tested bilateral.  Gait: Unassisted, Nonantalgic.    Radiographs:  None taken  Assessment & Plan:   Assessment: Mild paronychia sharply rated nail margins.  Plan: Offered matrixectomy she declined.  Just cut the nail back a little bit to alleviate her symptoms.  Encouraged her to continue her antibiotic.  Continue soaking Epson salt warm water.     Khan Chura T. [place], [place]

## 2019-12-16 ENCOUNTER — Ambulatory Visit: Payer: 59 | Admitting: Cardiology

## 2019-12-30 ENCOUNTER — Encounter: Payer: Self-pay | Admitting: Cardiology

## 2019-12-30 ENCOUNTER — Ambulatory Visit: Payer: 59 | Admitting: Cardiology

## 2019-12-30 ENCOUNTER — Other Ambulatory Visit: Payer: Self-pay

## 2019-12-30 VITALS — BP 168/100 | HR 56 | Ht 68.0 in | Wt 149.0 lb

## 2019-12-30 DIAGNOSIS — E782 Mixed hyperlipidemia: Secondary | ICD-10-CM | POA: Diagnosis not present

## 2019-12-30 DIAGNOSIS — Z79899 Other long term (current) drug therapy: Secondary | ICD-10-CM

## 2019-12-30 DIAGNOSIS — F17200 Nicotine dependence, unspecified, uncomplicated: Secondary | ICD-10-CM

## 2019-12-30 DIAGNOSIS — I10 Essential (primary) hypertension: Secondary | ICD-10-CM | POA: Diagnosis not present

## 2019-12-30 MED ORDER — FENOFIBRATE 160 MG PO TABS: 160.0000 mg | ORAL_TABLET | Freq: Every day | ORAL | 3 refills | Status: DC

## 2019-12-30 MED ORDER — PRAVASTATIN SODIUM 40 MG PO TABS: 40.0000 mg | ORAL_TABLET | Freq: Every day | ORAL | 3 refills | Status: DC

## 2019-12-30 MED ORDER — SPIRONOLACTONE 25 MG PO TABS: 25.0000 mg | ORAL_TABLET | Freq: Every day | ORAL | 3 refills | Status: DC

## 2019-12-30 NOTE — Patient Instructions (Signed)
Medication Instructions:  Please start Spironolactone 25 mg a day. Continue all other medications as listed  *If you need a refill on your cardiac medications before your next appointment, please call your pharmacy*  Lab Work: Please have blood work in approximately 10 days.  If you have labs (blood work) drawn today and your tests are completely normal, you will receive your results only by: Marland Kitchen MyChart Message (if you have MyChart) OR . A paper copy in the mail If you have any lab test that is abnormal or we need to change your treatment, we will call you to review the results.  You have been referred to the Hypertension Clinic.  Follow-Up: At [facility], you and your health needs are our priority.  As part of our continuing mission to provide you with exceptional heart care, we have created designated Provider Care Teams.  These Care Teams include your primary Cardiologist (physician) and Advanced Practice Providers (APPs -  Physician Assistants and Nurse Practitioners) who all work together to provide you with the care you need, when you need it.  We recommend signing up for the patient portal called "MyChart".  Sign up information is provided on this After Visit Summary.  MyChart is used to connect with patients for Virtual Visits (Telemedicine).  Patients are able to view lab/test results, encounter notes, upcoming appointments, etc.  Non-urgent messages can be sent to your provider as well.   To learn more about what you can do with MyChart, go to [email or web address].    Thank you for choosing [facility]!!

## 2019-12-30 NOTE — Progress Notes (Signed)
Cardiology Office Note   Date:  12/30/2019   ID:  Darlene Saunders, DOB 1956-02-17, MRN [id number]  PCP:  Carol Ada, MD  Cardiologist:   Candee Furbish, MD       History of Present Illness: Darlene Saunders is a 64 y.o. female here for evaluation of hyperlipidemia. She was last seen by me on 18/2012. She is a history of back pain, chronic medications, migraines previously treated at headache center. Triglycerides at one point were 305, LDL 134, HDL 21. Baseline triglycerides have been as high as the 1900 range.  She used to see Alinda Deem had [place] [facility] years ago. Dr. Kennyth Arnold stated has followed her cystic disease. No prior evidence of pancreatitis. She is a Marine scientist. Her father had carotid endarterectomy, AAA.  In her 42's started having cataracts. She was taking the statins at the time  She denies any exertional anginal symptoms, no syncope, no bleeding, no orthopnea, no PND.  She's had quite a few statin intolerances. She also continues to smoke.  She does drink alcohol, her AST has been elevated in the past. Only drinking wine on weekends.   She lost 15 pounds after hemoglobin A1c was 6.6 and 02/16/2015.. Right leg pain on Livalo.   10/01/17-no syncope bleeding orthopnea PND.  Occasional GERD, abdominal discomfort.  Family history of AAA.  Still smoking but trying to quit.  No chest pain.  Hemoglobin A1c went back up after gaining back 15 pounds.  Trying to alter diet.  11/25/18 - 08/27/18 - fell, left foot surgery. GI saw spasm, feels better. Retired in June.  Overall been doing quite well.  No chest pain fevers chills nausea vomiting syncope.  Saw Dr. Donzetta Matters for evaluation of mesenteric ischemia, duplex overall reassuring.  12/30/19 - here for HTN follow up. Anxious at night at times, heart pounding. Was on atenolol for 20 years and fine. Just started ramipril.  Will feel an occasional flutter in her chest.  Tobacco use.    Past Medical History:  Diagnosis Date  . Complete  hearing loss of right ear   . Diabetes mellitus without complication (HCC)    Type 2   . Esophageal reflux   . Hyperlipidemia   . Hypertension   . Migraine     Past Surgical History:  Procedure Laterality Date  . APPENDECTOMY    . hysterectomy, removal of right ovary    . LAPAROTOMY    . ORTHOPEDIC SURGERY       Current Outpatient Medications  Medication Sig Dispense Refill  . acyclovir (ZOVIRAX) 400 MG tablet Take 400 mg by mouth 2 (two) times daily.    Marland Kitchen ALPRAZolam (XANAX) 0.5 MG tablet Take 0.5 mg by mouth at bedtime.     Marland Kitchen atenolol (TENORMIN) 100 MG tablet Take 100 mg by mouth daily.     . Biotin 5000 MCG CAPS Take 1 capsule by mouth daily.    . cyclobenzaprine (FLEXERIL) 10 MG tablet Take 10 mg by mouth at bedtime.     Marland Kitchen esomeprazole (NEXIUM) 40 MG capsule Take 40 mg by mouth daily at 12 noon.    . fenofibrate 160 MG tablet Take 1 tablet (160 mg total) by mouth daily. 90 tablet 3  . fexofenadine (ALLEGRA) 180 MG tablet Take 180 mg by mouth as needed for allergies or rhinitis.    . indapamide (LOZOL) 2.5 MG tablet Take 2.5 mg by mouth daily.     Marland Kitchen levothyroxine (SYNTHROID, LEVOTHROID) 50 MCG tablet Take  50 mcg by mouth daily before breakfast.     . metFORMIN (GLUCOPHAGE-XR) 500 MG 24 hr tablet Take 500 mg by mouth 2 (two) times daily.    . metoCLOPramide (REGLAN) 10 MG tablet Take 10 mg by mouth as needed for nausea (Take one tablet every 4 to 6 hours with migraine headaches as directed for her migrains).    . Omega-3 Fatty Acids (FISH OIL) 1000 MG CAPS Take 3 capsules by mouth 2 (two) times daily.    . pravastatin (PRAVACHOL) 40 MG tablet Take 1 tablet (40 mg total) by mouth daily. 90 tablet 3  . ramipril (ALTACE) 5 MG capsule Take 5 mg by mouth daily.    . sertraline (ZOLOFT) 100 MG tablet Take 100 mg by mouth daily.     Marland Kitchen estradiol (ESTRACE) 2 MG tablet Take 2 mg by mouth daily.    Marland Kitchen spironolactone (ALDACTONE) 25 MG tablet Take 1 tablet (25 mg total) by mouth daily. 30  tablet 3   No current facility-administered medications for this visit.    Allergies:   Dexilant [dexlansoprazole], Crestor [rosuvastatin], Lipitor [atorvastatin], Penicillins, Sulfamethoxazole-trimethoprim, Augmentin [amoxicillin-pot clavulanate], and Cipro hc [ciprofloxacin-hydrocortisone]    Social History:  The patient  reports that she has been smoking cigarettes. She has a 3.75 pack-year smoking history. She has never used smokeless tobacco. She reports current alcohol use. She reports that she does not use drugs.   Family History:  The patient's family history includes Cancer - Other in her maternal grandmother and paternal grandmother; Cancer - Prostate in her father; Dementia in her mother; Hyperlipidemia in her father; Hypertension in her father, mother, and paternal grandfather; Thyroid disease in her mother.    ROS:  Please see the history of present illness.     PHYSICAL EXAM: VS:  BP (!) 168/100   Pulse (!) 56   Ht 5\' 8"  (1.727 m)   Wt 149 lb (67.6 kg)   SpO2 99%   BMI 22.66 kg/m  , BMI Body mass index is 22.66 kg/m. GEN: Well nourished, well developed, in no acute distress  HEENT: normal  Neck: no JVD, carotid bruits, or masses Cardiac: RRR; no murmurs, rubs, or gallops,no edema  Respiratory:  clear to auscultation bilaterally, normal work of breathing GI: soft, nontender, nondistended, + BS MS: no deformity or atrophy left leg, break, using walker Skin: warm and dry, no rash Neuro:  Alert and Oriented x 3, Strength and sensation are intact Psych: euthymic mood, full affect    EKG:  EKG is ordered today. The ekg ordered today demonstrates 12/30/2019-sinus bradycardia 56 no other significant abnormalities.  11/22/2018-sinus rhythm 60 borderline LVH 10/01/17 - NSR normal, personally viewed.06/16/16-sinus rhythm, 62 no other significant abnormality is. 06/14/15-sinus bradycardia rate 56 with no other abnormalities.   Recent Labs: No results found for requested labs  within last 8760 hours.    Lipid Panel    Component Value Date/Time   CHOL 202 (H) 12/01/2018 0815   CHOL 239 (H) 06/14/2015 0936   TRIG 349 (H) 12/01/2018 0815   TRIG 304 (H) 06/14/2015 0936   TRIG 923 (HH) 10/05/2006 0825   HDL 17 (L) 12/01/2018 0815   HDL 18 (L) 06/14/2015 0936   CHOLHDL 11.9 (H) 12/01/2018 0815   CHOLHDL 15.1 (H) 06/16/2016 0839   VLDL 68 (H) 06/16/2016 0839   LDLCALC 115 (H) 12/01/2018 0815   LDLCALC 160 (H) 06/14/2015 0936   LDLDIRECT 91.2 02/02/2009 0821      Wt Readings from Last  3 Encounters:  12/30/19 149 lb (67.6 kg)  11/25/18 152 lb 6.4 oz (69.1 kg)  08/26/18 155 lb (70.3 kg)      Other studies Reviewed: Additional studies/ records that were reviewed today include: Prior lab work, EKG, office notes. Review of the above records demonstrates: As above  Labs: 05/2015: LDL- P number 3124, TC 239, LDL 160, TG 304, HDL 18, CK 178 (Fenofibrate 160mg ) 06/2014: LDL-P number 2379, TC 167, LDL 97, TG 276, HDL 15.  LFTs are slightly elevated.  This is different from 6 months ago but similar to 5 years ago (no records available in between) 12/2013:  LDL-P number 2392, TC 183, LDL 106, TG 264, HDL 24 (Fenofibrate 160 mg qd, fish oil 6 g/d, Livalo 2 mg qd)  CK - normal   ASSESSMENT AND PLAN:  Mixed hyperlipidemia -Fish oil, fenofibrate, pravastatin -Triglycerides 339.  Originally they were 1900. -Diet modification as well -No changes made today  Diabetes mellitus - Last hemoglobin A1c 5.7 from 7.6. Weight loss.  She states that there is no diabetes in her family.  GERD/intermittent abdominal discomfort/family history of AAA/years of smoking. -Mesenteric duplex by Dr. 01/2014 did not corroborate with CT scan findings.  No further mesenteric ischemic symptoms since she has retired.  GI evaluated and look like stress was causing spasm.  Tobacco use -Continue with to encourage cessation, 2-3 per day.   Uncontrolled hypertension -Today her blood pressure  is quite high.  She does stated that at home even when she checks it sometimes it is this high.  Occasionally is in the 140 systolic.  Needs better control. -She has had issues with rashes with hydrochlorothiazide therefore she is taking indapamide 2.5 mg a day.  She also was recently started on ramipril 5 mg a day.  Atenolol 100 mg a day has been utilized for several years and previously controlled her blood pressure. -I would like to go ahead and start her on spironolactone 25 mg a day.  Understands that we need to monitor potassium.  We will go ahead and check her basic metabolic profile in 1 week and have her follow-up with the hypertension clinic.    Signed, Randie Heinz, MD  12/30/2019 5:03 PM    [facility] [address] [place], [place], [place]  [place] Phone: [phone]; Fax: [phone]

## 2020-01-10 ENCOUNTER — Ambulatory Visit (INDEPENDENT_AMBULATORY_CARE_PROVIDER_SITE_OTHER): Payer: 59 | Admitting: Pharmacist

## 2020-01-10 ENCOUNTER — Other Ambulatory Visit: Payer: Self-pay

## 2020-01-10 ENCOUNTER — Other Ambulatory Visit: Payer: 59

## 2020-01-10 VITALS — BP 180/86 | HR 61

## 2020-01-10 DIAGNOSIS — I10 Essential (primary) hypertension: Secondary | ICD-10-CM | POA: Diagnosis not present

## 2020-01-10 DIAGNOSIS — Z79899 Other long term (current) drug therapy: Secondary | ICD-10-CM

## 2020-01-10 NOTE — Progress Notes (Signed)
Patient ID: [name]                 DOB: November 26, 1955                      MRN: [id number]     HPI: Darlene Saunders is a 65 y.o. female referred by Dr. Marlou Porch to HTN clinic. PMH is significant for HLD with hypertriglyceridemia and statin intolerance (TG as high as 1900, now 339), DM (A1c improved from 7.6 to 5.7), back pain, migraines, and tobacco abuse. She was recently seen by Dr Marlou Porch on 12/30/19 and BP was elevated at 168/100 at that time. She was started on spironolactone 25mg  daily and presents today for follow up.  Pt presents today in good spirits. She reports tolerating her medications well. She checked her BP this AM and it was 161/80. Denies dizziness, balance problems, headache, or blurred vision. States she took indapamide and atenolol for years and they controlled her BP well. She noticed her BP starting to increase over the past few months. She has not had COVID and received both vaccines, no NSAID use, no pain out of the ordinary. She is stressed as she is caring for her 71 year old grandson who is doing Holiday representative at home. She had been noticing some palpitations and her heart racing when she would go to sleep. Reports this has improved since starting spironolactone.   Current HTN meds: atenolol 100mg  daily (10am), indapamide 2.5mg  daily (10am), ramipril 5mg  daily (10am), spironolactone 25mg  daily (10am)  Previously tried: HCTZ - rash  BP goal: <130/64mmHg  Family History: The patient's family history includes Cancer - Other in her maternal grandmother and paternal grandmother; Cancer - Prostate in her father; Dementia in her mother; Hyperlipidemia in her father; Hypertension in her father, mother, and paternal grandfather; Thyroid disease in her mother.   Social History: Pt is a retired Marine scientist. The patient  reports that she has been smoking cigarettes. She has a 3.75 pack-year smoking history. She has never used smokeless tobacco. She reports current alcohol use. She reports  that she does not use drugs.   Diet: First meal at 12-1pm. Cheese or PB and crackers, burgers once a week, likes chicken, pork, and veggies. Cut back on fruit bc of DM. Drinks ice water and iced tea with 1/4 cup sugar. 2 glasses of wine a night - Chardonnay or red wine. 2 cups of coffee and 2 cups of tea each day.  Exercise: Works in the yard  Home BP readings:   Wt Readings from Last 3 Encounters:  12/30/19 149 lb (67.6 kg)  11/25/18 152 lb 6.4 oz (69.1 kg)  08/26/18 155 lb (70.3 kg)   BP Readings from Last 3 Encounters:  12/30/19 (!) 168/100  10/04/19 (!) 171/85  11/25/18 140/80   Pulse Readings from Last 3 Encounters:  12/30/19 (!) 56  10/04/19 (!) 55  11/25/18 60    Renal function: CrCl cannot be calculated (Patient's most recent lab result is older than the maximum 21 days allowed.).  Past Medical History:  Diagnosis Date   Complete hearing loss of right ear    Diabetes mellitus without complication (HCC)    Type 2    Esophageal reflux    Hyperlipidemia    Hypertension    Migraine     Current Outpatient Medications on File Prior to Visit  Medication Sig Dispense Refill   acyclovir (ZOVIRAX) 400 MG tablet Take 400 mg by mouth 2 (  two) times daily.     ALPRAZolam (XANAX) 0.5 MG tablet Take 0.5 mg by mouth at bedtime.      atenolol (TENORMIN) 100 MG tablet Take 100 mg by mouth daily.      Biotin 5000 MCG CAPS Take 1 capsule by mouth daily.     cyclobenzaprine (FLEXERIL) 10 MG tablet Take 10 mg by mouth at bedtime.      esomeprazole (NEXIUM) 40 MG capsule Take 40 mg by mouth daily at 12 noon.     estradiol (ESTRACE) 2 MG tablet Take 2 mg by mouth daily.     fenofibrate 160 MG tablet Take 1 tablet (160 mg total) by mouth daily. 90 tablet 3   fexofenadine (ALLEGRA) 180 MG tablet Take 180 mg by mouth as needed for allergies or rhinitis.     indapamide (LOZOL) 2.5 MG tablet Take 2.5 mg by mouth daily.      levothyroxine (SYNTHROID, LEVOTHROID) 50 MCG  tablet Take 50 mcg by mouth daily before breakfast.      metFORMIN (GLUCOPHAGE-XR) 500 MG 24 hr tablet Take 500 mg by mouth 2 (two) times daily.     metoCLOPramide (REGLAN) 10 MG tablet Take 10 mg by mouth as needed for nausea (Take one tablet every 4 to 6 hours with migraine headaches as directed for her migrains).     Omega-3 Fatty Acids (FISH OIL) 1000 MG CAPS Take 3 capsules by mouth 2 (two) times daily.     pravastatin (PRAVACHOL) 40 MG tablet Take 1 tablet (40 mg total) by mouth daily. 90 tablet 3   ramipril (ALTACE) 5 MG capsule Take 5 mg by mouth daily.     sertraline (ZOLOFT) 100 MG tablet Take 100 mg by mouth daily.      spironolactone (ALDACTONE) 25 MG tablet Take 1 tablet (25 mg total) by mouth daily. 30 tablet 3   No current facility-administered medications on file prior to visit.    Allergies  Allergen Reactions   Dexilant [Dexlansoprazole] Diarrhea   Crestor [Rosuvastatin]     Muscle aches   Lipitor [Atorvastatin]     Muscle aches   Penicillins    Sulfamethoxazole-Trimethoprim Hives   Augmentin [Amoxicillin-Pot Clavulanate] Hives   Cipro Hc [Ciprofloxacin-Hydrocortisone]     Topical reaction     Assessment/Plan:  1. Hypertension - BP very elevated above goal <130/37mmHg. Will check BMET today with recent spironolactone start. Pending BMET results, will plan to increase dose of indapamide, ramipril, or spironolactone. Continue atenolol 100mg  daily. Encouraged pt to move atenolol and ramipril to PM dosing so that she does not take all 4 BP medications in the morning. Will schedule f/u appt in ~2 weeks pending BMET results and medication plan.  Ieisha Gao E. Malaky Tetrault, PharmD, BCACP, CPP [facility] [address] [place], [place] Phone: [phone]; Fax: [phone] 01/10/2020 3:52 PM

## 2020-01-10 NOTE — Patient Instructions (Addendum)
Move your atenolol and ramipril to evening dosing  Keep your indapamide and spironolactone in the morning  I'll give you a call tomorrow with your lab results to discuss increasing the dosing on some of your blood pressure medications  Look for Truvia or Stevia natural sweetener for your iced tea  Look for Mrs Sharilyn Sites salt substitute to use when cooking. Try to limit your daily sodium intake to < 2,000mg   Try to limit yourself to 2 cups of caffeine each day (including coffee and tea)

## 2020-01-11 ENCOUNTER — Telehealth: Payer: Self-pay | Admitting: Pharmacist

## 2020-01-11 LAB — BASIC METABOLIC PANEL
BUN/Creatinine Ratio: 29 — ABNORMAL HIGH (ref 12–28)
BUN: 32 mg/dL — ABNORMAL HIGH (ref 8–27)
CO2: 21 mmol/L (ref 20–29)
Calcium: 10.5 mg/dL — ABNORMAL HIGH (ref 8.7–10.3)
Chloride: 100 mmol/L (ref 96–106)
Creatinine, Ser: 1.1 mg/dL — ABNORMAL HIGH (ref 0.57–1.00)
GFR calc Af Amer: 61 mL/min/{1.73_m2} (ref >59)
GFR calc non Af Amer: 53 mL/min/{1.73_m2} — ABNORMAL LOW (ref >59)
Glucose: 89 mg/dL (ref 65–99)
Potassium: 4.5 mmol/L (ref 3.5–5.2)
Sodium: 137 mmol/L (ref 134–144)

## 2020-01-11 MED ORDER — SPIRONOLACTONE 50 MG PO TABS: 50.0000 mg | ORAL_TABLET | Freq: Every day | ORAL | 11 refills | Status: DC

## 2020-01-11 NOTE — Telephone Encounter (Signed)
Darlene Saunders is returning Darlene Saunders's call in regards to results.

## 2020-01-11 NOTE — Telephone Encounter (Signed)
SCr was 1.06 on 06/09/19 at [facility] (labs in [place]) - very mild increase in SCr to 1.1. BP remained very elevated at office visit yesterday at 180/86. Will increase spironolactone to 50mg  daily and recheck BMET and BP in 2 weeks.  Will call pt after 9am per her request to discuss med change.

## 2020-01-11 NOTE — Telephone Encounter (Signed)
Spoke with pt. She will increase spironolactone to 50mg  daily. Scheduled f/u appt in 2 weeks for BP check and BMET.

## 2020-01-12 ENCOUNTER — Telehealth: Payer: Self-pay

## 2020-01-12 NOTE — Telephone Encounter (Signed)
-----   Message from Jake Bathe, MD sent at 01/11/2020  6:53 AM EDT ----- Mild increase in creatinine. Continue with current HTN strategy,  Donato Schultz, MD

## 2020-01-12 NOTE — Telephone Encounter (Signed)
The patient has been notified of the lab result and verbalized understanding.  All questions (if any) were answered. Sigurd Sos, RN 01/12/2020 8:16 AM

## 2020-01-23 NOTE — Progress Notes (Signed)
Patient ID: GREG ECKRICH                 DOB: 1956/07/15                      MRN: [id number]     HPI: Darlene Saunders is a 64 y.o. female referred by Dr. Anne Fu to HTN clinic. PMH is significant for HLD with hypertriglyceridemia and statin intolerance (TG as high as 1900, now 339), DM (A1c improved from 7.6 to 5.7), back pain, migraines, and tobacco abuse. She was recently seen by Dr Anne Fu on 12/30/19 where BP was elevated at 168/100 and pt was started on spironolactone 25mg  daily. She followed up with me on 01/10/20 and BP remained elevated at 180/86. Spironolactone was increased to 50mg  daily and she was encouraged to move ramipril and atenolol dosing to PM to spread out BP medication regimen.  Pt presents today in good spirits. She reports tolerating her medications well. Denies dizziness, balance problems, headache, or blurred vision. States she took indapamide and atenolol for years and they controlled her BP well. She noticed her BP starting to increase over the past few months. She has not had COVID and received both vaccines, no NSAID use, no pain out of the ordinary. She is stressed as she is caring for her 65 year old grandson who is doing at home. She had been noticing some palpitations and her heart racing when she would go to sleep. Reports this has improved since starting spironolactone.   Pt presents today in good spirits. She reports tolerating her higher dose of spironolactone well and has moved both ramipril and atenolol dosing to PM. Reports sleeping better and few palpitations at night. She monitors her BP at home, did not bring cuff or readings today. Recalls home readings ranging 148/80s - 165/90, average 150s/80s, HR 60-62. She has started walking a bit more and worked in the yard all morning today. She has tried cutting back on caffeinated tea intake.  Current HTN meds: -indapamide 2.5mg  daily (10am) -spironolactone 50mg  daily (10am) -atenolol 100mg  daily  (pm) -ramipril 5mg  daily (pm)  Previously tried: HCTZ - rash  BP goal: <130/62mmHg  Family History: The patient's family history includes Cancer - Other in her maternal grandmother and paternal grandmother; Cancer - Prostate in her father; Dementia in her mother; Hyperlipidemia in her father; Hypertension in her father, mother, and paternal grandfather; Thyroid disease in her mother.   Social History: Pt is a retired Research scientist (medical). The patient  reports that she has been smoking cigarettes. She has a 3.75 pack-year smoking history. She has never used smokeless tobacco. She reports current alcohol use. She reports that she does not use drugs.   Diet: First meal at 12-1pm. Cheese or PB and crackers, burgers once a week, likes chicken, pork, and veggies. Cut back on fruit bc of DM. Drinks ice water and iced tea with 1/4 cup sugar. 2 glasses of wine a night - Chardonnay or red wine. 2 cups of coffee and 2 cups of tea each day.  Exercise: Works in the yard  Home BP readings: 148/80s - 165/90, average 150s/80s, HR 60-62  Wt Readings from Last 3 Encounters:  12/30/19 149 lb (67.6 kg)  11/25/18 152 lb 6.4 oz (69.1 kg)  08/26/18 155 lb (70.3 kg)   BP Readings from Last 3 Encounters:  01/10/20 (!) 180/86  12/30/19 (!) 168/100  10/04/19 (!) 171/85   Pulse Readings from Last 3 Encounters:  01/10/20 61  12/30/19 (!) 56  10/04/19 (!) 55    Renal function: CrCl cannot be calculated (Unknown ideal weight.).  Past Medical History:  Diagnosis Date  . Complete hearing loss of right ear   . Diabetes mellitus without complication (HCC)    Type 2   . Esophageal reflux   . Hyperlipidemia   . Hypertension   . Migraine     Current Outpatient Medications on File Prior to Visit  Medication Sig Dispense Refill  . acyclovir (ZOVIRAX) 400 MG tablet Take 400 mg by mouth 2 (two) times daily.    Marland Kitchen ALPRAZolam (XANAX) 0.5 MG tablet Take 0.5 mg by mouth at bedtime.     Marland Kitchen atenolol (TENORMIN) 100 MG tablet Take  100 mg by mouth daily.     . Biotin 5000 MCG CAPS Take 1 capsule by mouth daily.    . cyclobenzaprine (FLEXERIL) 10 MG tablet Take 10 mg by mouth at bedtime.     Marland Kitchen esomeprazole (NEXIUM) 40 MG capsule Take 40 mg by mouth daily at 12 noon.    Marland Kitchen estradiol (ESTRACE) 2 MG tablet Take 2 mg by mouth daily.    . fenofibrate 160 MG tablet Take 1 tablet (160 mg total) by mouth daily. 90 tablet 3  . fexofenadine (ALLEGRA) 180 MG tablet Take 180 mg by mouth as needed for allergies or rhinitis.    . indapamide (LOZOL) 2.5 MG tablet Take 2.5 mg by mouth daily.     Marland Kitchen levothyroxine (SYNTHROID, LEVOTHROID) 50 MCG tablet Take 50 mcg by mouth daily before breakfast.     . metFORMIN (GLUCOPHAGE-XR) 500 MG 24 hr tablet Take 500 mg by mouth 2 (two) times daily.    . metoCLOPramide (REGLAN) 10 MG tablet Take 10 mg by mouth as needed for nausea (Take one tablet every 4 to 6 hours with migraine headaches as directed for her migrains).    . Omega-3 Fatty Acids (FISH OIL) 1000 MG CAPS Take 3 capsules by mouth 2 (two) times daily.    . pravastatin (PRAVACHOL) 40 MG tablet Take 1 tablet (40 mg total) by mouth daily. 90 tablet 3  . ramipril (ALTACE) 5 MG capsule Take 5 mg by mouth daily.    . sertraline (ZOLOFT) 100 MG tablet Take 100 mg by mouth daily.     Marland Kitchen spironolactone (ALDACTONE) 50 MG tablet Take 1 tablet (50 mg total) by mouth daily. 30 tablet 11   No current facility-administered medications on file prior to visit.    Allergies  Allergen Reactions  . Dexilant [Dexlansoprazole] Diarrhea  . Crestor [Rosuvastatin]     Muscle aches  . Lipitor [Atorvastatin]     Muscle aches  . Penicillins   . Sulfamethoxazole-Trimethoprim Hives  . Augmentin [Amoxicillin-Pot Clavulanate] Hives  . Cipro Hc [Ciprofloxacin-Hydrocortisone]     Topical reaction     Assessment/Plan:  1. Hypertension - BP has improved notable from last visit, however remains above goal <130/28mmHg. Will check BMET today with recent  spironolactone dose increase. Pending BMET results, will plan to increase dose of indapamide, ramipril, or spironolactone. Continue atenolol 100mg  daily. Will schedule f/u appt in ~3 weeks pending BMET results and medication plan.  Darlene Saunders, PharmD, BCACP, [place] [address], [place], [place] [address] Phone: [phone]; Fax: [phone] 01/23/2020 8:59 AM

## 2020-01-24 ENCOUNTER — Ambulatory Visit (INDEPENDENT_AMBULATORY_CARE_PROVIDER_SITE_OTHER): Payer: 59 | Admitting: Pharmacist

## 2020-01-24 ENCOUNTER — Other Ambulatory Visit: Payer: Self-pay

## 2020-01-24 VITALS — BP 158/86 | HR 71

## 2020-01-24 DIAGNOSIS — I10 Essential (primary) hypertension: Secondary | ICD-10-CM | POA: Diagnosis not present

## 2020-01-24 NOTE — Patient Instructions (Addendum)
It was nice to see yo utoday!  Your blood pressure goal is < 130/15mmHg  We will check lab work today and I'll call you tomorrow with the results  For now, continue taking the following medications for your blood pressure:  -indapamide 2.5mg  daily (AM) -spironolactone 50mg  daily (AM) -atenolol 100mg  daily (PM) -ramipril 5mg  daily (PM)  Continue to monitor your blood pressure at home. Bring your home cuff and readings to your follow up visit

## 2020-01-25 ENCOUNTER — Telehealth: Payer: Self-pay | Admitting: Pharmacist

## 2020-01-25 LAB — BASIC METABOLIC PANEL
BUN/Creatinine Ratio: 18 (ref 12–28)
BUN: 20 mg/dL (ref 8–27)
CO2: 19 mmol/L — ABNORMAL LOW (ref 20–29)
Calcium: 9.9 mg/dL (ref 8.7–10.3)
Chloride: 104 mmol/L (ref 96–106)
Creatinine, Ser: 1.1 mg/dL — ABNORMAL HIGH (ref 0.57–1.00)
GFR calc Af Amer: 61 mL/min/{1.73_m2} (ref >59)
GFR calc non Af Amer: 53 mL/min/{1.73_m2} — ABNORMAL LOW (ref >59)
Glucose: 164 mg/dL — ABNORMAL HIGH (ref 65–99)
Potassium: 3.6 mmol/L (ref 3.5–5.2)
Sodium: 141 mmol/L (ref 134–144)

## 2020-01-25 MED ORDER — RAMIPRIL 10 MG PO CAPS: 10.0000 mg | ORAL_CAPSULE | Freq: Every day | ORAL | 11 refills | Status: DC

## 2020-01-25 NOTE — Telephone Encounter (Signed)
BMET stable, BP remained elevated at visit yesterday. Will increase ramipril from 5mg  to 10mg  daily. Continue other meds. Follow up in 3 weeks for BP check and BMET. Pt is aware of plan.

## 2020-02-13 ENCOUNTER — Telehealth: Payer: Self-pay | Admitting: Cardiology

## 2020-02-13 DIAGNOSIS — E782 Mixed hyperlipidemia: Secondary | ICD-10-CM

## 2020-02-13 DIAGNOSIS — I10 Essential (primary) hypertension: Secondary | ICD-10-CM

## 2020-02-13 NOTE — Telephone Encounter (Signed)
Patient states she has had 10 days of muscle pains. Thought it was from gardening but its still there. Similar to when her K was low. Med adjustments made recently would actually increase K. Has been on fenofibrate and pravastatin for awhile. Muscle issues with crestor. I will have patient hold pravastatin and fenofibrate for now to see if this improves symptoms. Patient will come for [facility] tomorrow. She is scheduled to see [place] on Thursday but we are 100% booked tomorrow. Will order bmp and CK

## 2020-02-13 NOTE — Telephone Encounter (Signed)
  Patient would like to speak to the pharmacist because she started having muscle pain in her legs this past weekend. She states that recently her medication was changed and she thinks she may be low on potassium. Please advise.

## 2020-02-14 ENCOUNTER — Other Ambulatory Visit: Payer: Self-pay

## 2020-02-14 ENCOUNTER — Other Ambulatory Visit: Payer: 59 | Admitting: *Deleted

## 2020-02-14 DIAGNOSIS — E782 Mixed hyperlipidemia: Secondary | ICD-10-CM

## 2020-02-14 DIAGNOSIS — I10 Essential (primary) hypertension: Secondary | ICD-10-CM

## 2020-02-15 LAB — BASIC METABOLIC PANEL
BUN/Creatinine Ratio: 18 (ref 12–28)
BUN: 21 mg/dL (ref 8–27)
CO2: 21 mmol/L (ref 20–29)
Calcium: 10.4 mg/dL — ABNORMAL HIGH (ref 8.7–10.3)
Chloride: 100 mmol/L (ref 96–106)
Creatinine, Ser: 1.14 mg/dL — ABNORMAL HIGH (ref 0.57–1.00)
GFR calc Af Amer: 59 mL/min/{1.73_m2} — ABNORMAL LOW (ref >59)
GFR calc non Af Amer: 51 mL/min/{1.73_m2} — ABNORMAL LOW (ref >59)
Glucose: 105 mg/dL — ABNORMAL HIGH (ref 65–99)
Potassium: 4.3 mmol/L (ref 3.5–5.2)
Sodium: 140 mmol/L (ref 134–144)

## 2020-02-15 LAB — CK: Total CK: 166 U/L (ref 32–182)

## 2020-02-15 NOTE — Telephone Encounter (Signed)
Electrolytes are normal. CK normal. Called patient to let her know the results. Left VM

## 2020-02-16 ENCOUNTER — Ambulatory Visit (INDEPENDENT_AMBULATORY_CARE_PROVIDER_SITE_OTHER): Payer: 59 | Admitting: Pharmacist

## 2020-02-16 ENCOUNTER — Other Ambulatory Visit: Payer: Self-pay

## 2020-02-16 VITALS — BP 152/78 | HR 68

## 2020-02-16 DIAGNOSIS — I10 Essential (primary) hypertension: Secondary | ICD-10-CM | POA: Diagnosis not present

## 2020-02-16 MED ORDER — RAMIPRIL 10 MG PO CAPS: 10.0000 mg | ORAL_CAPSULE | Freq: Two times a day (BID) | ORAL | 3 refills | Status: DC

## 2020-02-16 NOTE — Patient Instructions (Addendum)
It was nice to see you today!  Your blood pressure is improving and closer to your goal <130/81mmHg  Increase your ramipril to 10mg  twice a day  Continue taking indapamide 2.5mg  and spironolactone 50mg  in the morning and atenolol 100mg  in the evening for your blood pressure  Try stopping your pravastatin for a longer period of 1-2 weeks to see if your muscle aches improve. If they do not in 2 weeks, try stopping your fenofibrate too  Follow up in clinic for a blood pressure check and lab work in 1 month

## 2020-02-16 NOTE — Progress Notes (Signed)
Patient ID: Darlene Saunders                 DOB: 1956/02/19                      MRN: [id number]   HPI: Darlene Saunders is a 64 y.o. female referred by Dr. Anne Saunders to HTN clinic. PMH is significant for HLD with hypertriglyceridemia and statin intolerance (TG as high as 1900, now 339), DM (A1c improved from 7.6 to 5.7), back pain, migraines, and tobacco abuse. She was recently seen by Dr Darlene Saunders on 12/30/19 where BP was elevated at 168/100 and pt was started on spironolactone 25mg  daily. She followed up with me on 01/10/20 and BP remained elevated at 180/86. Spironolactone was increased to 50mg  daily and she was encouraged to move ramipril and atenolol dosing to PM to spread out BP medication regimen. BP improved at follow up visit 5/4 to 158/86. Her ramipril was increased to 10mg  daily. Pt called clinic 3 weeks later with reports of muscle pain in her legs. She was advised to hold her fenofibrate and pravastatin (previously experienced muscle pain on rosuvastatin and atorvastatin). BMET and CK were normal.  States she took indapamide and atenolol for years and they controlled her BP well. She noticed her BP starting to increase over the past few months. She has not had COVID and received both vaccines, no NSAID use, no pain out of the ordinary. She is stressed as she is caring for her 106 year old grandson who is doing at home. She had been noticing some palpitations and her heart racing when she would go to sleep. Reports both have improved since starting spironolactone.   Pt presents today in good spirits. She reports tolerating her medications well. Denies dizziness, balance problems, headache, or blurred vision. She tried stopping her diuretics for 2 days to see if her muscle cramps would improve. They did not so she resumed her BP meds. Also tried stopping her pravastatin and fenofibrate for a few days and didn't notice much relief so she has resumed these as well. She has been working more in the  garden but muscle aches do not feel like sore muscles from this.  BP using home Omron cuff: 147/79. Clinic BP reading: 152/78. Home cuff measuring close to clinic reading. Clinic reading a bit higher than home readings on average.  Current HTN meds: -indapamide 2.5mg  daily (10am) -spironolactone 50mg  daily (10am) -atenolol 100mg  daily (pm) -ramipril 10mg  daily (pm)  Previously tried: HCTZ - rash  BP goal: <130/67mmHg  Family History: The patient's family history includes Cancer - Other in her maternal grandmother and paternal grandmother; Cancer - Prostate in her father; Dementia in her mother; Hyperlipidemia in her father; Hypertension in her father, mother, and paternal grandfather; Thyroid disease in her mother.   Social History: Pt is a retired 14. The patient  reports that she has been smoking cigarettes. She has a 3.75 pack-year smoking history. She has never used smokeless tobacco. She reports current alcohol use. She reports that she does not use drugs.   Diet: First meal at 12-1pm. Cheese or PB and crackers, burgers once a week, likes chicken, pork, and veggies. Cut back on fruit bc of DM. Drinks ice water and iced tea with 1/4 cup sugar. 2 glasses of wine a night - Chardonnay or red wine. 2 cups of coffee and 2 cups of tea each day.  Exercise: Works in the yard  Research scientist (medical)  BP readings: 130/73 - 150/83, most 140s/80s, HR 59 - 69. Omron bicep cuff  Wt Readings from Last 3 Encounters:  12/30/19 149 lb (67.6 kg)  11/25/18 152 lb 6.4 oz (69.1 kg)  08/26/18 155 lb (70.3 kg)   BP Readings from Last 3 Encounters:  01/24/20 (!) 158/86  01/10/20 (!) 180/86  12/30/19 (!) 168/100   Pulse Readings from Last 3 Encounters:  01/24/20 71  01/10/20 61  12/30/19 (!) 56    Renal function: CrCl cannot be calculated (Unknown ideal weight.).  Past Medical History:  Diagnosis Date  . Complete hearing loss of right ear   . Diabetes mellitus without complication (HCC)    Type 2   .  Esophageal reflux   . Hyperlipidemia   . Hypertension   . Migraine     Current Outpatient Medications on File Prior to Visit  Medication Sig Dispense Refill  . acyclovir (ZOVIRAX) 400 MG tablet Take 400 mg by mouth 2 (two) times daily.    Marland Kitchen ALPRAZolam (XANAX) 0.5 MG tablet Take 0.5 mg by mouth at bedtime.     Marland Kitchen atenolol (TENORMIN) 100 MG tablet Take 100 mg by mouth daily.     . Biotin 5000 MCG CAPS Take 1 capsule by mouth daily.    . cyclobenzaprine (FLEXERIL) 10 MG tablet Take 10 mg by mouth at bedtime.     Marland Kitchen esomeprazole (NEXIUM) 40 MG capsule Take 40 mg by mouth daily at 12 noon.    Marland Kitchen estradiol (ESTRACE) 2 MG tablet Take 2 mg by mouth daily.    . fenofibrate 160 MG tablet Take 1 tablet (160 mg total) by mouth daily. 90 tablet 3  . fexofenadine (ALLEGRA) 180 MG tablet Take 180 mg by mouth as needed for allergies or rhinitis.    . indapamide (LOZOL) 2.5 MG tablet Take 2.5 mg by mouth daily.     Marland Kitchen levothyroxine (SYNTHROID, LEVOTHROID) 50 MCG tablet Take 50 mcg by mouth daily before breakfast.     . metFORMIN (GLUCOPHAGE-XR) 500 MG 24 hr tablet Take 500 mg by mouth 2 (two) times daily.    . metoCLOPramide (REGLAN) 10 MG tablet Take 10 mg by mouth as needed for nausea (Take one tablet every 4 to 6 hours with migraine headaches as directed for her migrains).    . Omega-3 Fatty Acids (FISH OIL) 1000 MG CAPS Take 3 capsules by mouth 2 (two) times daily.    . pravastatin (PRAVACHOL) 40 MG tablet Take 1 tablet (40 mg total) by mouth daily. 90 tablet 3  . ramipril (ALTACE) 10 MG capsule Take 1 capsule (10 mg total) by mouth daily. 30 capsule 11  . sertraline (ZOLOFT) 100 MG tablet Take 100 mg by mouth daily.     Marland Kitchen spironolactone (ALDACTONE) 50 MG tablet Take 1 tablet (50 mg total) by mouth daily. 30 tablet 11   No current facility-administered medications on file prior to visit.    Allergies  Allergen Reactions  . Dexilant [Dexlansoprazole] Diarrhea  . Crestor [Rosuvastatin]     Muscle  aches  . Lipitor [Atorvastatin]     Muscle aches  . Penicillins   . Sulfamethoxazole-Trimethoprim Hives  . Augmentin [Amoxicillin-Pot Clavulanate] Hives  . Cipro Hc [Ciprofloxacin-Hydrocortisone]     Topical reaction     Assessment/Plan:  1. Hypertension - BP continues to improve and is closer to goal <130/73mmHg. Will increase ramipril to 10mg  BID and continue indapamide 2.5mg  daily, spironolactone 50mg  daily, and atenolol 100mg  daily. Encouraged pt to keep  a log of home BP readings as we will adjust meds based on home readings rather than clinic reading due to mild white coat HTN. F/u in clinic in 4 weeks for BP check and BMET.  2. Myalgias - Electrolytes and CK are normal. Advised pt to stop pravastatin for 1-2 weeks to see if her bilateral leg pain improves. If not, advised her to try stopping her fenofibrate as well. She previously experienced myalgias on both rosuvastatin and atorvastatin. Will reassess at next visit.  [name] E. Supple, PharmD, BCACP, CPP [facility] [address] [place], [place] [address] Phone: [phone]; Fax: [phone] 02/16/2020 7:56 AM

## 2020-02-17 ENCOUNTER — Telehealth: Payer: Self-pay | Admitting: Cardiology

## 2020-02-17 NOTE — Telephone Encounter (Signed)
Darlene Saunders is calling due to having to reschedule her pharmacist appointment for 03/13/20 as 2:30 PM due to having another appointment on the date of her original. Darlene Saunders wanted to make sure this appointment would still be with [facility] due to rescheduling. Please advise.

## 2020-02-17 NOTE — Telephone Encounter (Signed)
I'm off on 6/22 - called pt and moved her appt to 6/23 when I am in the office.

## 2020-03-12 ENCOUNTER — Ambulatory Visit: Payer: 59

## 2020-03-13 ENCOUNTER — Ambulatory Visit: Payer: 59

## 2020-03-14 ENCOUNTER — Ambulatory Visit (INDEPENDENT_AMBULATORY_CARE_PROVIDER_SITE_OTHER): Payer: 59 | Admitting: Pharmacist

## 2020-03-14 ENCOUNTER — Other Ambulatory Visit: Payer: Self-pay

## 2020-03-14 VITALS — BP 122/80 | HR 63

## 2020-03-14 DIAGNOSIS — T466X5A Adverse effect of antihyperlipidemic and antiarteriosclerotic drugs, initial encounter: Secondary | ICD-10-CM | POA: Diagnosis not present

## 2020-03-14 DIAGNOSIS — E782 Mixed hyperlipidemia: Secondary | ICD-10-CM

## 2020-03-14 DIAGNOSIS — G720 Drug-induced myopathy: Secondary | ICD-10-CM | POA: Diagnosis not present

## 2020-03-14 DIAGNOSIS — I10 Essential (primary) hypertension: Secondary | ICD-10-CM

## 2020-03-14 MED ORDER — EZETIMIBE 10 MG PO TABS
10.0000 mg | ORAL_TABLET | Freq: Every day | ORAL | 11 refills | Status: DC
Start: 2020-03-14 — End: 2021-05-21

## 2020-03-14 MED ORDER — SPIRONOLACTONE 50 MG PO TABS: 50.0000 mg | ORAL_TABLET | Freq: Every day | ORAL | 3 refills | Status: DC

## 2020-03-14 NOTE — Patient Instructions (Addendum)
Start taking ezetimibe (Zetia) 10mg  once daily for your cholesterol. It will lower your LDL (bad) cholesterol about 20%. Continue taking your fenofibrate for your triglycerides  Continue taking your blood pressure medications - your readings are excellent and at goal < 130/60mmHg!  Recheck fasting cholesterol on Monday, September 20th any time after 7:30am

## 2020-03-14 NOTE — Progress Notes (Signed)
Patient ID: LAVONNA LAMPRON                 DOB: September 17, 1956                      MRN: [id number]   HPI: Darlene Saunders is a 64 y.o. female referred by Dr. Anne Fu to HTN clinic. PMH is significant for HLD with hypertriglyceridemia and statin intolerance (TG as high as 1900, now 339), DM (A1c improved from 7.6 to 5.7), back pain, migraines, and tobacco abuse. Over the past few months, her spironolactone and ramipril have been titrated and med timing has been spaced apart to help bring BP to goal.  States she took indapamide and atenolol for years and they controlled her BP well. She noticed her BP starting to increase over the past few months. She has not had COVID and received both vaccines, no NSAID use, no pain out of the ordinary. She is stressed as she is caring for her 46 year old grandson who is doing Research scientist (medical) at home. She had been noticing some palpitations and her heart racing when she would go to sleep. Reports both have improved since starting spironolactone. Most recently, BP was elevated at 152/78 on 02/16/20 and ramipril was increased to 10mg  BID.  Pt presents today in good spirits. Reports tolerating her BP medications well. Denies dizziness, blurred vision, headache, and falls. Home BP readings have improved the longer she's been on higher dose of ramipril - last 3 readings all at goal. Home Omron bicep cuff was previously calibrated in clinic and was accurate.  Has felt much better since stopping pravastatin, her myalgias are gone. She has continued on fenofibrate and is tolerating this well. Previously experienced myalgias on rosuvastatin, atorvastatin, and Livalo. Taking OTC fish oil. Insurance does not cover PCSK9i or Vascepa without ezetimibe trial first.  Current HTN meds: -indapamide 2.5mg  daily (am) -spironolactone 50mg  daily (am) -atenolol 100mg  daily (pm) -ramipril 10mg  BID  Previously tried: HCTZ - rash  BP goal: <130/37mmHg  Family History: The patient's family  history includes Cancer - Other in her maternal grandmother and paternal grandmother; Cancer - Prostate in her father; Dementia in her mother; Hyperlipidemia in her father; Hypertension in her father, mother, and paternal grandfather; Thyroid disease in her mother.   Social History: Pt is a retired . The patient  reports that she has been smoking cigarettes. She has a 3.75 pack-year smoking history. She has never used smokeless tobacco. She reports current alcohol use. She reports that she does not use drugs.   Diet: First meal at 12-1pm. Cheese or PB and crackers, burgers once a week, likes chicken, pork, and veggies. Cut back on fruit bc of DM. Drinks ice water, cut out tea. 2 glasses of wine a night - Chardonnay or red wine. 2 cups of coffee and 2 cups of tea each day.  Exercise: Works in the yard  Home BP readings: 138/78, 140/75, 149/77, 166/76, 122/76, 119/68, 115/63, HR 58 - 63. Omron bicep cuff  Wt Readings from Last 3 Encounters:  12/30/19 149 lb (67.6 kg)  11/25/18 152 lb 6.4 oz (69.1 kg)  08/26/18 155 lb (70.3 kg)   BP Readings from Last 3 Encounters:  02/16/20 (!) 152/78  01/24/20 (!) 158/86  01/10/20 (!) 180/86   Pulse Readings from Last 3 Encounters:  02/16/20 68  01/24/20 71  01/10/20 61    Renal function: CrCl cannot be calculated (Patient's most recent lab result is  older than the maximum 21 days allowed.).  Past Medical History:  Diagnosis Date  . Complete hearing loss of right ear   . Diabetes mellitus without complication (HCC)    Type 2   . Esophageal reflux   . Hyperlipidemia   . Hypertension   . Migraine     Current Outpatient Medications on File Prior to Visit  Medication Sig Dispense Refill  . acyclovir (ZOVIRAX) 400 MG tablet Take 400 mg by mouth 2 (two) times daily.    Marland Kitchen ALPRAZolam (XANAX) 0.5 MG tablet Take 0.5 mg by mouth at bedtime.     Marland Kitchen atenolol (TENORMIN) 100 MG tablet Take 100 mg by mouth daily.     . Biotin 5000 MCG CAPS Take 1  capsule by mouth daily.    . cyclobenzaprine (FLEXERIL) 10 MG tablet Take 10 mg by mouth at bedtime.     Marland Kitchen esomeprazole (NEXIUM) 40 MG capsule Take 40 mg by mouth daily at 12 noon.    Marland Kitchen estradiol (ESTRACE) 2 MG tablet Take 2 mg by mouth daily.    . fenofibrate 160 MG tablet Take 1 tablet (160 mg total) by mouth daily. 90 tablet 3  . fexofenadine (ALLEGRA) 180 MG tablet Take 180 mg by mouth as needed for allergies or rhinitis.    . indapamide (LOZOL) 2.5 MG tablet Take 2.5 mg by mouth daily.     Marland Kitchen levothyroxine (SYNTHROID, LEVOTHROID) 50 MCG tablet Take 50 mcg by mouth daily before breakfast.     . metFORMIN (GLUCOPHAGE-XR) 500 MG 24 hr tablet Take 500 mg by mouth 2 (two) times daily.    . metoCLOPramide (REGLAN) 10 MG tablet Take 10 mg by mouth as needed for nausea (Take one tablet every 4 to 6 hours with migraine headaches as directed for her migrains).    . Omega-3 Fatty Acids (FISH OIL) 1000 MG CAPS Take 3 capsules by mouth 2 (two) times daily.    . pravastatin (PRAVACHOL) 40 MG tablet Take 1 tablet (40 mg total) by mouth daily. 90 tablet 3  . ramipril (ALTACE) 10 MG capsule Take 1 capsule (10 mg total) by mouth 2 (two) times daily. 180 capsule 3  . sertraline (ZOLOFT) 100 MG tablet Take 100 mg by mouth daily.     Marland Kitchen spironolactone (ALDACTONE) 50 MG tablet Take 1 tablet (50 mg total) by mouth daily. 30 tablet 11   No current facility-administered medications on file prior to visit.    Allergies  Allergen Reactions  . Dexilant [Dexlansoprazole] Diarrhea  . Crestor [Rosuvastatin]     Muscle aches  . Lipitor [Atorvastatin]     Muscle aches  . Penicillins   . Sulfamethoxazole-Trimethoprim Hives  . Augmentin [Amoxicillin-Pot Clavulanate] Hives  . Cipro Hc [Ciprofloxacin-Hydrocortisone]     Topical reaction     Assessment/Plan:  1. Hypertension - BP much improved and is now at goal <130/106mmHg at home and in clinic. Will check BMET today with recent dose increase of ramipril and plan  to continue ramipril 10mg  BID, indapamide 2.5mg  daily, spironolactone 50mg  daily, and atenolol 100mg  daily. F/u in HTN clinic as needed.  2. Hyperlipidemia - Pt with myalgias on atorvastatin, rosuvastatin, pravastatin, and Livalo. Will start Zetia 10mg  daily. LDL goal < 100 due to DM. Continue fenofibrate and OTC fish oil for elevated TG (as high as 1900 in the past). Recheck lipids with direct LDL in 3 months. Insurance will not cover Vascepa or PCSK9i without Zetia challenge.  Richmond Coldren E. Breauna Mazzeo, PharmD, BCACP, CPP  [facility] [address], [place], [place] [address] Phone: [phone]; Fax: [phone] 03/14/2020 8:10 AM

## 2020-03-15 LAB — BASIC METABOLIC PANEL
BUN/Creatinine Ratio: 20 (ref 12–28)
BUN: 21 mg/dL (ref 8–27)
CO2: 22 mmol/L (ref 20–29)
Calcium: 9.8 mg/dL (ref 8.7–10.3)
Chloride: 98 mmol/L (ref 96–106)
Creatinine, Ser: 1.06 mg/dL — ABNORMAL HIGH (ref 0.57–1.00)
GFR calc Af Amer: 64 mL/min/{1.73_m2} (ref >59)
GFR calc non Af Amer: 56 mL/min/{1.73_m2} — ABNORMAL LOW (ref >59)
Glucose: 143 mg/dL — ABNORMAL HIGH (ref 65–99)
Potassium: 4.2 mmol/L (ref 3.5–5.2)
Sodium: 135 mmol/L (ref 134–144)

## 2020-05-09 ENCOUNTER — Other Ambulatory Visit: Payer: Self-pay | Admitting: Cardiology

## 2020-05-09 MED ORDER — FENOFIBRATE 160 MG PO TABS: 160.0000 mg | ORAL_TABLET | Freq: Every day | ORAL | 2 refills | Status: DC

## 2020-05-09 NOTE — Telephone Encounter (Signed)
Pt requested her medication fenofibrate be resent to a different pharmacy. Resent medication as requested. Confirmation received.

## 2020-05-09 NOTE — Telephone Encounter (Signed)
New Message    *STAT* If patient is at the pharmacy, call can be transferred to refill team.   1. Which medications need to be refilled? (please list name of each medication and dose if known) fenofibrate 160 MG tablet    2. Which pharmacy/location (including street and city if local pharmacy) is medication to be sent to? [facility] [place]  3. Do they need a 30 day or 90 day supply? 90  Patient is requesting a new rx to be sent to [facility] [address] because its cheaper

## 2020-05-14 ENCOUNTER — Other Ambulatory Visit: Payer: Self-pay | Admitting: Nephrology

## 2020-05-14 DIAGNOSIS — N1831 Chronic kidney disease, stage 3a: Secondary | ICD-10-CM

## 2020-05-23 ENCOUNTER — Ambulatory Visit
Admission: RE | Admit: 2020-05-23 | Discharge: 2020-05-23 | Disposition: A | Discharge status: Home / Self Care | Payer: 59 | Source: Ambulatory Visit | Attending: Nephrology | Admitting: Nephrology

## 2020-05-23 DIAGNOSIS — N1831 Chronic kidney disease, stage 3a: Secondary | ICD-10-CM

## 2020-06-11 ENCOUNTER — Other Ambulatory Visit: Payer: Self-pay

## 2020-06-11 ENCOUNTER — Other Ambulatory Visit: Payer: 59 | Admitting: *Deleted

## 2020-06-11 DIAGNOSIS — E782 Mixed hyperlipidemia: Secondary | ICD-10-CM

## 2020-06-11 LAB — LDL CHOLESTEROL, DIRECT: LDL Direct: 121 mg/dL — ABNORMAL HIGH (ref 0–99)

## 2020-06-11 LAB — LIPID PANEL
Chol/HDL Ratio: 8.7 ratio — ABNORMAL HIGH (ref 0.0–4.4)
Cholesterol, Total: 199 mg/dL (ref 100–199)
HDL: 23 mg/dL — ABNORMAL LOW (ref >39)
LDL Chol Calc (NIH): 122 mg/dL — ABNORMAL HIGH (ref 0–99)
Triglycerides: 301 mg/dL — ABNORMAL HIGH (ref 0–149)
VLDL Cholesterol Cal: 54 mg/dL — ABNORMAL HIGH (ref 5–40)

## 2020-06-11 LAB — HEPATIC FUNCTION PANEL
ALT: 16 IU/L (ref 0–32)
AST: 23 IU/L (ref 0–40)
Albumin: 4.8 g/dL (ref 3.8–4.8)
Alkaline Phosphatase: 39 IU/L — ABNORMAL LOW (ref 44–121)
Bilirubin Total: <0.2 mg/dL (ref 0.0–1.2)
Bilirubin, Direct: 0.11 mg/dL (ref 0.00–0.40)
Total Protein: 7.5 g/dL (ref 6.0–8.5)

## 2020-06-12 ENCOUNTER — Telehealth: Payer: Self-pay | Admitting: Pharmacist

## 2020-06-12 NOTE — Telephone Encounter (Signed)
Pt is returning [name]'s call.

## 2020-06-12 NOTE — Telephone Encounter (Signed)
Jake Bathe, MD  06/12/2020 6:52 AM EDT Back to Top    LDL 122. Zetia 10. Let's see if PCSK9 can get approved.  Thanks  Donato Schultz, MD

## 2020-06-12 NOTE — Telephone Encounter (Signed)
Called pt and left message to discuss PCKS9i therapy.   LDL currently 122 and TG remain elevated at 301 on ezetimibe 10mg  daily, fenofibrate 160mg  daily, and OTC fish oil 3g BID. TG previously as high as 1800 but improved after stopping oral estrogen, reducing alcohol intake, and starting fish oil and fibrate.  She is previously intolerant to multiple statins including rosuvastatin, atorvastatin, pravastatin, and Livalo 2mg  and 4mg  (myalgias with all). Unfortunately, insurance would not cover PCKS9i or Vascespa without ezetimibe trial first. Now that she is taking ezetimibe and labs still remain above goal as expected, will submit prior authorizations for PCSK9i and Vascepa. LDL goal < 100 due to history of DM and elevated LDL-P.

## 2020-06-12 NOTE — Telephone Encounter (Signed)
Spoke with pt and discussed lab results. Advised her that I have submitted prior authorizations for Repatha and Vascepa. Will call pt back once approved.

## 2020-06-13 NOTE — Telephone Encounter (Signed)
Both prior authorizations were denied, appeals letters have been faxed today.

## 2020-06-22 NOTE — Telephone Encounter (Signed)
Called for status on appeals, both are still in progress. Rep stated they will take 30-45 days to review and that we could fax to expedited line instead #[phone]. This has been done.

## 2020-07-03 MED ORDER — ROSUVASTATIN CALCIUM 5 MG PO TABS: ORAL_TABLET | ORAL | 11 refills | Status: DC

## 2020-07-03 MED ORDER — ICOSAPENT ETHYL 1 G PO CAPS: 2.0000 g | ORAL_CAPSULE | Freq: Two times a day (BID) | ORAL | 3 refills | Status: DC

## 2020-07-03 MED ORDER — VASCEPA 1 G PO CAPS: 2.0000 g | ORAL_CAPSULE | Freq: Two times a day (BID) | ORAL | 3 refills | Status: DC

## 2020-07-03 NOTE — Telephone Encounter (Signed)
Thanks for your help Donato Schultz, MD

## 2020-07-03 NOTE — Telephone Encounter (Signed)
Called to follow up on appeals again. Initial rep stated that determination was not made yet, however I asked to be transferred to the appeals dept who stated that Vascepa was approved but Repatha was denied. Asked them to fax determination letters since they never did when they actually made the decision. Will follow up once we receive this.

## 2020-07-03 NOTE — Addendum Note (Signed)
Addended by: Alyxis Grippi E on: 07/03/2020 11:35 AM   Modules accepted: Orders

## 2020-07-03 NOTE — Addendum Note (Signed)
Addended by: Jammy Stlouis E on: 07/03/2020 11:21 AM   Modules accepted: Orders

## 2020-07-03 NOTE — Telephone Encounter (Addendum)
Repatha denied by insurance even with appeals letter because her plan will only cover Repatha for ASCVD or HeFH indications, not primary prevention (even though this is an FDA approved indication).  Called pt to advise her that she can stop OTC fish oil and start taking Vascepa 2g BID to help lower CV risk, but that her insurance will not cover Repatha unfortunately. She is willing to retry low dose rosuvastatin 5mg  2-3 days per week (LDL currently 122, goal < due to DM). She will continue on ezetimibe and fenofibrate as well. Will call pt in 1 month to assess tolerability.

## 2020-08-06 ENCOUNTER — Telehealth: Payer: Self-pay | Admitting: Pharmacist

## 2020-08-06 DIAGNOSIS — E782 Mixed hyperlipidemia: Secondary | ICD-10-CM

## 2020-08-06 NOTE — Telephone Encounter (Signed)
Called pt to follow up with rosuvastatin tolerability. Overall feeling ok on 2 days a week dosing. She still continues on ezetimibe, fenofibrate, and Vascepa. She is willing to increase frequency of rosuvastatin to 3 days a week. Will recheck labs in 3 months.

## 2020-10-23 ENCOUNTER — Other Ambulatory Visit: Payer: Self-pay

## 2020-10-23 ENCOUNTER — Other Ambulatory Visit: Payer: Medicare Other | Admitting: *Deleted

## 2020-10-23 DIAGNOSIS — E782 Mixed hyperlipidemia: Secondary | ICD-10-CM

## 2020-10-23 LAB — LIPID PANEL
Chol/HDL Ratio: 10.3 ratio — ABNORMAL HIGH (ref 0.0–4.4)
Cholesterol, Total: 144 mg/dL (ref 100–199)
HDL: 14 mg/dL — ABNORMAL LOW (ref >39)
LDL Chol Calc (NIH): 96 mg/dL (ref 0–99)
Triglycerides: 192 mg/dL — ABNORMAL HIGH (ref 0–149)
VLDL Cholesterol Cal: 34 mg/dL (ref 5–40)

## 2020-10-23 LAB — HEPATIC FUNCTION PANEL
ALT: 21 [IU]/L (ref 0–32)
AST: 31 [IU]/L (ref 0–40)
Albumin: 4.6 g/dL (ref 3.8–4.8)
Alkaline Phosphatase: 40 [IU]/L — ABNORMAL LOW (ref 44–121)
Bilirubin Total: <0.2 mg/dL (ref 0.0–1.2)
Bilirubin, Direct: 0.1 mg/dL (ref 0.00–0.40)
Total Protein: 7.2 g/dL (ref 6.0–8.5)

## 2020-10-23 LAB — LDL CHOLESTEROL, DIRECT: LDL Direct: 84 mg/dL (ref 0–99)

## 2020-10-24 ENCOUNTER — Telehealth: Payer: Self-pay | Admitting: Pharmacist

## 2020-10-24 NOTE — Telephone Encounter (Addendum)
Called pt to discuss most recent lipid panel results.  Overall labs are much improved. Since increasing rosuvastatin frequency to 3 days a week, direct LDL has improved from 121 to 84 and is now at goal < 100.  Her TG have also improved from 301 to 192 (baseline as high as 1300).  Advised pt to work on increasing activity and choosing foods higher in unsaturated vs saturated fat to help increase her HDL which routinely runs low.  Will plan to continue pt on current lipid meds of: rosuvastatin 5mg  3 days per week, ezetimibe 10mg  daily, fenofibrate 160mg  daily, and Vascepa 2g BID. She was very excited about her lab results and appreciative for the assistance.  States her BP has been running a little higher with SBP ~140, however she has been more stressed lately and BP has only been elevated the past month or so. Advised pt to continue to monitor, if BP remains above goal <130/23mmHg after holiday stress results, I'd be happy to see her for BP follow up (last seen by me in office last July, BP controlled at 122/80 on atenolol 100mg  daily, ramipril 10mg  BID, spironolactone 50mg  daily, and indapamide 2.5mg  daily.

## 2020-10-29 NOTE — Telephone Encounter (Signed)
Thank you for the assistance.  Appreciated. Donato Schultz, MD

## 2021-01-08 DIAGNOSIS — G720 Drug-induced myopathy: Secondary | ICD-10-CM

## 2021-01-08 DIAGNOSIS — T466X5A Adverse effect of antihyperlipidemic and antiarteriosclerotic drugs, initial encounter: Secondary | ICD-10-CM

## 2021-01-08 NOTE — Progress Notes (Addendum)
[facility])                                            [facility] Quality Pharmacy Team                                        Statin Quality Measure Assessment    01/08/2021  Darlene Saunders 01/30/1956 [id number]  Per review of chart and payor information, this patient has been flagged for non-adherence to the following CMS Quality Measure:   []  Statin Use in Persons with Diabetes  [x]  Statin Use in Persons with Cardiovascular Disease    Currently prescribed statin:  [x]  Yes []  No     Comments: Per records, rosuvastatin 5 mg three days per week. However, per [facility], patient last filled November 2021. Pharmacist, was notified re: possible compliance issue as well as clinic staff at her PCP were notified as well.  History of statin use:            [x]  Yes []  No   Comments: Rosuvastatin, Pravastatin,Pitavastatin. Per documentation, statin myopathy (G72.0, T46.6X5A) was updated on problems list and associated to 03/14/2020 cardiology encounter. Currently, there are no cardiology appointments to date.   Please consider renewing CPT code G 72.0 for this year to remove patient from CMS measure.    Thank you for your time,   [facility], PharmD Clinical Pharmacist [facility] Cell: [phone]

## 2021-02-21 ENCOUNTER — Other Ambulatory Visit: Payer: Self-pay | Admitting: Cardiology

## 2021-02-27 ENCOUNTER — Other Ambulatory Visit: Payer: Self-pay | Admitting: *Deleted

## 2021-02-27 ENCOUNTER — Telehealth: Payer: Self-pay | Admitting: Cardiology

## 2021-02-27 MED ORDER — INDAPAMIDE 2.5 MG PO TABS: 2.5000 mg | ORAL_TABLET | Freq: Every day | ORAL | 3 refills | Status: DC

## 2021-02-27 NOTE — Telephone Encounter (Signed)
Spoke with pt and she states she has been having issues with BP for about a week now.  BP on 6/3 was 176/96 before medications.  BP today was 188/99 before meds, 188/96 at [facility] and 195/101 at 1pm.  Had medications around 10am.  Denies any symptoms.  Denies increased salt in diet or use of NSAIDs.  Current medications effecting BP include:  Ramipril 10mg  BID Atenolol 100mg  QD Spironolactone 50mg  QD Farxiga 10mg  QD  Advised I will send to Dr. for review and advisement.

## 2021-02-27 NOTE — Telephone Encounter (Signed)
Let's get her back on indapamide 2.5mg  PO QD  Thanks  Donato Schultz, MD

## 2021-02-27 NOTE — Telephone Encounter (Signed)
Spoke with pt and made her aware of recommendations.  Pt agreeable to plan.  She still has some Indapamide from previous prescription, so she will go ahead and get this started.  Advised pt to monitor BP for 7-10 days, at least 2 hours after meds, and contact the office with those readings so we can be sure readings are coming down.  Pt agreeable.

## 2021-02-27 NOTE — Telephone Encounter (Signed)
Pt c/o BP issue: STAT if pt c/o blurred vision, one-sided weakness or slurred speech  1. What are your last 5 BP readings? 188/98, 188/96, 195/101,   2. Are you having any other symptoms (ex. Dizziness, headache, blurred vision, passed out)? No   3. What is your BP issue? Pt state that her bp has been high since 6/3, she is concerned and would recommendation

## 2021-04-05 ENCOUNTER — Other Ambulatory Visit: Payer: Self-pay | Admitting: Cardiology

## 2021-04-18 ENCOUNTER — Other Ambulatory Visit: Payer: Self-pay | Admitting: *Deleted

## 2021-04-18 ENCOUNTER — Other Ambulatory Visit: Payer: Self-pay | Admitting: Cardiology

## 2021-04-18 MED ORDER — SPIRONOLACTONE 50 MG PO TABS: ORAL_TABLET | ORAL | 0 refills | Status: DC

## 2021-04-18 MED ORDER — FENOFIBRATE 160 MG PO TABS: 160.0000 mg | ORAL_TABLET | Freq: Every day | ORAL | 0 refills | Status: DC

## 2021-05-16 ENCOUNTER — Ambulatory Visit: Payer: Medicare Other | Attending: Internal Medicine

## 2021-05-16 ENCOUNTER — Other Ambulatory Visit (HOSPITAL_BASED_OUTPATIENT_CLINIC_OR_DEPARTMENT_OTHER): Payer: Self-pay

## 2021-05-16 DIAGNOSIS — Z23 Encounter for immunization: Secondary | ICD-10-CM

## 2021-05-16 MED ORDER — PFIZER-BIONT COVID-19 VAC-TRIS 30 MCG/0.3ML IM SUSP
INTRAMUSCULAR | 0 refills | Status: DC
  Filled 2021-05-16: qty 0.3, 1d supply, fill #0

## 2021-05-16 NOTE — Progress Notes (Signed)
   Covid-19 Vaccination Clinic  Name:  LICHELLE VIETS    MRN: [id number] DOB: [birth year]  05/16/2021  Ms. Wahba was observed post Covid-19 immunization for 15 minutes without incident. She was provided with Vaccine Information Sheet and instruction to access the V-Safe system.   Ms. Reichow was instructed to call 911 with any severe reactions post vaccine: Difficulty breathing  Swelling of face and throat  A fast heartbeat  A bad rash all over body  Dizziness and weakness   Immunizations Administered     Name Date Dose VIS Date Route   PFIZER Comrnaty(Gray TOP) Covid-19 Vaccine 05/16/2021  3:36 PM 0.3 mL 08/30/2020 Intramuscular   Manufacturer: [employer], [employer]   Lot: [id number]   NDC: [phone]

## 2021-05-20 ENCOUNTER — Encounter: Payer: Self-pay | Admitting: Physician Assistant

## 2021-05-20 NOTE — Progress Notes (Signed)
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   Per chart review it appears she has primarily been followed for risk factor modification. She had a prior AAA duplex in 2019 without evidence for aneurysm but did demonstrate mesenteric and celiac stenosis so was recommended to see [facility] but do not see this occurred. Renal duplex (ordered by outside provider) 05/2020 showed 3.2cm AAA + aortic atherosclerosis, bilateral renal cysts, no RAS. She has been followed in our pharmD clinic for both her HTN and HLD. She has been previously intolerant to multiple statins including rosuvastatin, atorvastatin, pravastatin, and livalo (myalgias with all). Her insurance would not cover PCSK9i but did cover Vascepa. Earlier this year, she was willing to re-trial rosuvastatin at last check along with Zetia, fenofibrate, and initiation of Vascepa. Her last labs 10/2020 showed improved triglyceride to 192, keeping in mind she had prior values in the 1300s range. However, she did not tolerate even low dose rosuvastatin due to myalgias and also has had to reduce Zetia to 2x weekly because of this. This has improved her symptoms. She has not had labs rechecked since this med change. Regarding HTN management, she has had prior rash with HCTZ per pharmD note but has tolerated indapamide.  She presents for routine follow-up today overall doing well from a cardiac standpoint without any CP, SOB, dizziness or syncope. She has been pleased with her BP running better at home, I.e. 120s/80s. She does recall  having remote testing for a kidney cyst but not anything recently. She also recalls being told she had a heart murmur many years ago. She has been followed by Dr. Marca Ancona with GI for abdominal pain and diarrhea. She thinks the 3rd medication they've started has helped somewhat. No symptoms to suggest subclavian steal.  Labwork independently reviewed: 10/2020 LDL 84, LFTS OK, Trig 194 09/256 Cr 1.14, calcium 10.4   Past Medical History:  Diagnosis Date   AAA (abdominal aortic aneurysm) (HCC)    Aortic atherosclerosis (HCC)    Chronic kidney disease, stage 3a (HCC)    Complete hearing loss of right ear    Diabetes mellitus without complication (HCC)    Type 2    Esophageal reflux    Hyperlipidemia    Hypertension    Hypertriglyceridemia    Migraine    PAD (peripheral artery disease) (HCC)     Past Surgical History:  Procedure Laterality Date   APPENDECTOMY     hysterectomy, removal of right ovary     LAPAROTOMY     ORTHOPEDIC SURGERY      Current Medications: Current Meds  Medication Sig   acyclovir (ZOVIRAX) 400 MG tablet Take 400 mg by mouth 2 (two) times daily.   ALPRAZolam (XANAX) 0.5 MG tablet Take 0.5 mg by mouth at bedtime.    atenolol (TENORMIN) 100 MG tablet Take 100 mg by mouth daily.    Biotin 5000 MCG CAPS Take 1 capsule by mouth daily.   COVID-19 mRNA Vac-TriS, Pfizer, (PFIZER-BIONT COVID-19 VAC-TRIS) SUSP injection Inject into the muscle.   dapagliflozin  propanediol (FARXIGA) 10 MG TABS tablet Take 10 mg by mouth daily.   esomeprazole (NEXIUM) 40 MG capsule Take 40 mg by mouth daily at 12 noon.   estradiol (ESTRACE) 2 MG tablet Take 2 mg by mouth daily.   ezetimibe (ZETIA) 10 MG tablet Take 10 mg by mouth 2 (two) times a week.   fenofibrate 160 MG tablet Take 1 tablet (160 mg total) by mouth daily. Please make overdue appt with Dr. Anne Fu before anymore refills. Thank you 1st attempt   indapamide (LOZOL) 2.5 MG tablet Take 1 tablet (2.5 mg total) by mouth daily.    levothyroxine (SYNTHROID, LEVOTHROID) 50 MCG tablet Take 50 mcg by mouth daily before breakfast.    metoCLOPramide (REGLAN) 10 MG tablet Take 10 mg by mouth as needed for nausea (Take one tablet every 4 to 6 hours with migraine headaches as directed for her migrains).   ramipril (ALTACE) 10 MG capsule Take 1 capsule (10 mg total) by mouth 2 (two) times daily. Please schedule appointment for future refills.   sertraline (ZOLOFT) 100 MG tablet Take 100 mg by mouth daily.    spironolactone (ALDACTONE) 50 MG tablet TAKE 1 TABLET(50 MG) BY MOUTH DAILY   VASCEPA 1 g capsule Take 2 capsules (2 g total) by mouth 2 (two) times daily.      Allergies:   Dexilant [dexlansoprazole], Crestor [rosuvastatin], Lipitor [atorvastatin], Livalo [pitavastatin], Penicillins, Pravastatin, Sulfamethoxazole-trimethoprim, Augmentin [amoxicillin-pot clavulanate], and Cipro hc [ciprofloxacin-hydrocortisone]   Social History   Socioeconomic History   Marital status: Married    Spouse name: Not on file   Number of children: Not on file   Years of education: Not on file   Highest education level: Not on file  Occupational History   Not on file  Tobacco Use   Smoking status: Some Days    Packs/day: 0.25    Years: 15.00    Pack years: 3.75    Types: Cigarettes   Smokeless tobacco: Never  Vaping Use   Vaping Use: Never used  Substance and Sexual Activity   Alcohol use: Yes    Comment: Pt reports a few glasses of wine a night   Drug use: No   Sexual activity: Not on file  Other Topics Concern   Not on file  Social History Narrative   Not on file   Social Determinants of Health   Financial Resource Strain: Not on file  Food Insecurity: Not on file  Transportation Needs: Not on file  Physical Activity: Not on file  Stress: Not on file  Social Connections: Not on file     Family History:  The patient's family history includes Cancer - Other in her maternal grandmother and paternal grandmother; Cancer  - Prostate in her father; Dementia in her mother; Hyperlipidemia in her father; Hypertension in her father, mother, and paternal grandfather; Thyroid disease in her mother.  ROS:   Please see the history of present illness.  All other systems are reviewed and otherwise negative.    EKGs/Labs/Other Studies Reviewed:    Studies reviewed are outlined and summarized above. Reports included below if pertinent.  Renal artery duplex 05/2020 IMPRESSION: 1. Abdominal aorta has diffuse atherosclerotic disease. Aortic Atherosclerosis (ICD10-I70.0). 2. Bilateral renal arteries are patent. No significant renal artery stenosis. 3. Abdominal aortic aneurysm measuring 3.2 cm. Recommend follow-up every 3 years. This recommendation follows ACR consensus guidelines: White Paper of the ACR Incidental Findings Committee II on Vascular Findings. J Am Coll Radiol 2013; 10:789-794. 4. Bilateral renal  cysts with two indeterminate structures in the left kidney. Recommend further characterization with renal MRI, with and without contrast. Based on patient's lab values from May 2021, patient should be a candidate for post contrast MRI. 5. Indeterminate echogenic focus in the right kidney. Cannot exclude a nonobstructive kidney stone.   These results will be called to the ordering clinician or representative by the Radiologist Assistant, and communication documented in the PACS or [employer].  AAA duplex 2019  Final Interpretation:   Abdominal Aorta: No evidence of an abdominal aortic aneurysm was  visualized.   The largest aortic measurement is 2.0 cm.   Stenosis:  >70% stenosis in the celiac, superior mesenteric and inferior mesenteric  arteries.     *See table(s) above for measurements and observations.     Electronically signed by Julien Nordmann on 10/23/2017 at 8:56:27 PM.     EKG:  EKG is ordered today, personally reviewed, demonstrating SB 57bpm, nonspecific STTW changes similar to  prior.   Recent Labs: 10/23/2020: ALT 21  Recent Lipid Panel    Component Value Date/Time   CHOL 144 10/23/2020 0830   CHOL 239 (H) 06/14/2015 0936   TRIG 192 (H) 10/23/2020 0830   TRIG 304 (H) 06/14/2015 0936   TRIG 923 (HH) 10/05/2006 0825   HDL 14 (L) 10/23/2020 0830   HDL 18 (L) 06/14/2015 0936   CHOLHDL 10.3 (H) 10/23/2020 0830   CHOLHDL 15.1 (H) 06/16/2016 0839   VLDL 68 (H) 06/16/2016 0839   LDLCALC 96 10/23/2020 0830   LDLCALC 160 (H) 06/14/2015 0936   LDLDIRECT 84 10/23/2020 0830   LDLDIRECT 91.2 02/02/2009 0821    PHYSICAL EXAM:    VS:  BP 140/78   Pulse (!) 57   Ht 5\' 8"  (1.727 m)   Wt 137 lb 3.2 oz (62.2 kg)   SpO2 98%   BMI 20.86 kg/m   BMI: Body mass index is 20.86 kg/m.  GEN: Well nourished, well developed female in no acute distress HEENT: normocephalic, atraumatic Neck: no JVD or masses. No carotid bruits. + Left subclavian bruit Cardiac: RRR; + soft SEM LSB, no rubs or gallops, no edema  Respiratory:  clear to auscultation bilaterally, normal work of breathing GI: soft, nontender, nondistended, + BS MS: no deformity or atrophy Skin: warm and dry, no rash Neuro:  Alert and Oriented x 3, Strength and sensation are intact, follows commands Psych: euthymic mood, full affect  Wt Readings from Last 3 Encounters:  05/21/21 137 lb 3.2 oz (62.2 kg)  12/30/19 149 lb (67.6 kg)  11/25/18 152 lb 6.4 oz (69.1 kg)     ASSESSMENT & PLAN:   1. Hyperlipidemia with severe hypertriglyceridemia with history of statin myopathy - she has been intolerant to multiple statins as outlined above, and at this point is only on fenofibrate, Vascepa, and Zetia. She had to stop low dose rosuvastatin and decrease Zetia to 2x/week due to recurrent myalgias. She has already eaten so we will undertake fasting bloodwork (CMET/lipid profile) when she returns for study as below. Anticipate referral back to lipid clinic once we have those values. With evidence of PAD I suspect it may be  wise to re-appeal for PCSK9i if LDL is suboptimally controlled.  2. Essential HTN - BP suboptimally controlled, suggest goal <130/80 - however, she reports good control at home, and states she is somewhat nervous for this appointment. The patient was provided instructions on monitoring blood pressure at home and keeping log. Will update her labs as above (  thyroid, kidney, electrolytes). Will check in with her about BP when we get those results. I do suspect some left subclavian stenosis so asked her to check her BP in the right arm. She is currently on atenolol, indapamide, rampiril and spironolactone.  3. PAD with mesenteric and celiac stenosis by prior US, AAA (due 2024) - Dr. Anne FuSkains previously suggested referral to VVS given her abdominal PAD. I wonder to what degree there is a relationship to her chronic abdominal pain. Will follow through with referral. Regarding AAA, she will require f/u study by 05/2023. We can order in our office closer to that time or [facility]VS may end up following. Aneurysm precautions reviewed today - outlined on AVS below (BP control, lifting, family hx, avoidance of fluoroquinolones). She has a positive family hx of aortic aneurysms in her father and paternal aunt.  4. CKD stage IIIa by last labs with hypercalcemia noted on labs in 2021 - recheck with labs when she returns as above. Follows with Dr. Signe ColtUpton - reports prior hx of proteinuria that she said may have been due to HTN or DM. I also advised she touch base with nephrology or urology about prior renal US findings (per report: "Bilateral renal cysts with two indeterminate structures in the left kidney. Recommend characterization with renal MRI, with and without contrast."  5. Left subclavian bruit - BP 140/80 in right arm, 138/78 in left arm, no symptoms of subclavian steal. Will obtain carotid duplex to evaluate further.  6. Heart murmur - very subtle at LSB. May be related to left subclavian bruit but will obtain  echocardiogram for evaluation. She can sometimes feel her heart pound (fleeting) with more force but no overt or sustained tachypalpitations. We do not have a recent Hgb on file so will obtain CBC when she returns for labs.   Disposition: F/u with me or Dr. Anne FuSkains in 6-8 weeks.   Medication Adjustments/Labs and Tests Ordered: Current medicines are reviewed at length with the patient today.  Concerns regarding medicines are outlined above. Medication changes, Labs and Tests ordered today are summarized above and listed in the Patient Instructions accessible in Encounters.   Signed, Laurann Montanaayna N Abdinasir Spadafore, PA-C  05/21/2021 3:37 PM    [facility] [address], [place], [place]  [address] Phone: [phone]) [phone]; Fax: [phone]) [phone]

## 2021-05-20 NOTE — Progress Notes (Addendum)
[facility])                                            [facility] Quality Pharmacy Team                                        Statin Quality Measure Assessment    05/20/2021  Darlene Saunders 1956-04-24 [id number]  Per review of chart and payor information, this patient has been flagged for non-adherence to the following CMS Quality Measure:   [x]  Statin Use in Persons with Diabetes  []  Statin Use in Persons with Cardiovascular Disease    Currently prescribed statin:  [x]  Yes []  No     Comments: Per records, rosuvastatin 5 mg three days per week. However, per [facility], patient last filled November 2021. Pharmacist, was notified re: possible compliance issue as well as clinic staff at her PCP were notified as well. Patient is currently prescribed zetia, fenofibrate, and OTC fish oil.   History of statin use:            [x]  Yes []  No   Comments: Rosuvastatin, Pravastatin,Pitavastatin. Per documentation, statin myopathy (G72.0, T46.6X5A) was updated on problems list and associated to 03/14/2020 cardiology encounter.  Please consider associating code G 72.0, to remove from this year's CMS measure, by associating to the next encounter on 05/21/2021.  Thank you for your time,   December 2021, PharmD Clinical Pharmacist [facility] Cell: [phone]

## 2021-05-21 ENCOUNTER — Encounter: Payer: Self-pay | Admitting: Physician Assistant

## 2021-05-21 ENCOUNTER — Ambulatory Visit (INDEPENDENT_AMBULATORY_CARE_PROVIDER_SITE_OTHER): Payer: Medicare Other | Admitting: Physician Assistant

## 2021-05-21 ENCOUNTER — Other Ambulatory Visit: Payer: Self-pay

## 2021-05-21 VITALS — BP 140/78 | HR 57 | Ht 68.0 in | Wt 137.2 lb

## 2021-05-21 DIAGNOSIS — I10 Essential (primary) hypertension: Secondary | ICD-10-CM | POA: Diagnosis not present

## 2021-05-21 DIAGNOSIS — E781 Pure hyperglyceridemia: Secondary | ICD-10-CM

## 2021-05-21 DIAGNOSIS — R0989 Other specified symptoms and signs involving the circulatory and respiratory systems: Secondary | ICD-10-CM

## 2021-05-21 DIAGNOSIS — I739 Peripheral vascular disease, unspecified: Secondary | ICD-10-CM | POA: Diagnosis not present

## 2021-05-21 DIAGNOSIS — N1831 Chronic kidney disease, stage 3a: Secondary | ICD-10-CM

## 2021-05-21 DIAGNOSIS — R011 Cardiac murmur, unspecified: Secondary | ICD-10-CM

## 2021-05-21 DIAGNOSIS — E782 Mixed hyperlipidemia: Secondary | ICD-10-CM

## 2021-05-21 DIAGNOSIS — G720 Drug-induced myopathy: Secondary | ICD-10-CM

## 2021-05-21 DIAGNOSIS — T466X5D Adverse effect of antihyperlipidemic and antiarteriosclerotic drugs, subsequent encounter: Secondary | ICD-10-CM

## 2021-05-21 NOTE — Patient Instructions (Addendum)
Medication Instructions:  Your physician recommends that you continue on your current medications as directed. Please refer to the Current Medication list given to you today.  *If you need a refill on your cardiac medications before your next appointment, please call your pharmacy*   Lab Work: 1 WEEK:  YOU WILL HAVE TO BE OFF BIOTIN FOR 1 WEEK BEFORE YOU COME:  CMET, LIPID, CBC, TSH. & FT4. MAKE SURE YOU COME FASTING.   If you have labs (blood work) drawn today and your tests are completely normal, you will receive your results only by: MyChart Message (if you have MyChart) OR A paper copy in the mail If you have any lab test that is abnormal or we need to change your treatment, we will call you to review the results.   Testing/Procedures: Your physician has requested that you have an echocardiogram. Echocardiography is a painless test that uses sound waves to create images of your heart. It provides your doctor with information about the size and shape of your heart and how well your heart's chambers and valves are working. This procedure takes approximately one hour. There are no restrictions for this procedure.   Your physician has requested that you have a carotid duplex. This test is an ultrasound of the carotid arteries in your neck. It looks at blood flow through these arteries that supply the brain with blood. Allow one hour for this exam. There are no restrictions or special instructions.   You have been referred to  VEIN & VASCULAR SURGERY.     Follow-Up: At [facility], you and your health needs are our priority.  As part of our continuing mission to provide you with exceptional heart care, we have created designated Provider Care Teams.  These Care Teams include your primary Cardiologist (physician) and Advanced Practice Providers (APPs -  Physician Assistants and Nurse Practitioners) who all work together to provide you with the care you need, when you need it.  We  recommend signing up for the patient portal called "MyChart".  Sign up information is provided on this After Visit Summary.  MyChart is used to connect with patients for Virtual Visits (Telemedicine).  Patients are able to view lab/test results, encounter notes, upcoming appointments, etc.  Non-urgent messages can be sent to your provider as well.   To learn more about what you can do with MyChart, go to [email or web address].    Your next appointment:   6-8 WEEKS   The format for your next appointment:   In Person  Provider:   Ronie Spies, PA-C    Other Instructions  Blood Pressure Monitoring  We need to get a better idea of what your blood pressure is running at home. Here are some instructions to follow: - I would recommend using a blood pressure cuff that goes on your arm. The wrist ones can be inaccurate. If you're purchasing one for the first time, try to select one that also reports your heart rate because this can be helpful information as well. PLEASE CHECK IT IN YOUR RIGHT ARM. - To check your blood pressure, choose a time at least 3 hours after taking your blood pressure medicines. If you can sample it at different times of the day, that's great - it might give you more information about how your blood pressure fluctuates. Remain seated in a chair for 5 minutes quietly beforehand, then check it.  - Please record a list of those readings. In general we are looking for a goal  blood pressure of less than 130/80. If you notice your blood pressure tending to run higher than this, please call us.  Information About Your Aneurysm  One of your tests has shown an aneurysm of your abdominal aorta. The word "aneurysm" refers to a bulge in an artery (blood vessel). Most people think of them in the context of an emergency, but yours was found incidentally. At this point there is nothing you need to do from a procedure standpoint, but there are some important things to keep in mind for  day-to-day life.  Mainstays of therapy for aneurysms include very good blood pressure control, healthy lifestyle, and avoiding tobacco products and street drugs. Research has raised concern that antibiotics in the fluoroquinolone class could be associated with increased risk of having an aneurysm develop or tear. This includes medicines that end in "floxacin," like Cipro or Levaquin. Make sure to discuss this information with other healthcare providers if you require antibiotics.  Since aneurysms can run in families, you should discuss your diagnosis with first degree relatives as they may need to be screened for this. Regular mild-moderate physical exercise is important, but avoid heavy lifting/weight lifting over 30lbs, chopping wood, shoveling snow or digging heavy earth with a shovel. It is best to avoid activities that cause grunting or straining (medically referred to as a "Valsalva maneuver"). This happens when a person bears down against a closed throat to increase the strength of arm or abdominal muscles. There's often a tendency to do this when lifting heavy weights, doing sit-ups, push-ups or chin-ups, etc., but it may be harmful.  This is a finding I would expect to be monitored periodically by your cardiology team. Most unruptured thoracic aortic aneurysms cause no symptoms, so they are often found during exams for other conditions. Contact a health care provider if you develop any discomfort in your upper back, neck, abdomen, trouble swallowing, cough or hoarseness, or unexplained weight loss. Get help right away if you develop severe pain in your upper back or abdomen that may move into your chest and arms, or any other concerning symptoms such as shortness of breath or fever.  Your prior renal ultrasound in 05/2020 mentioned having kidney cysts. Please touch base with your kidney doctor or primary care to discuss whether further imaging (MRI) is needed to evaluate these.

## 2021-05-22 ENCOUNTER — Other Ambulatory Visit: Payer: Self-pay | Admitting: Cardiology

## 2021-05-28 ENCOUNTER — Other Ambulatory Visit: Payer: Medicare Other | Admitting: *Deleted

## 2021-05-28 ENCOUNTER — Other Ambulatory Visit: Payer: Self-pay

## 2021-05-28 DIAGNOSIS — R011 Cardiac murmur, unspecified: Secondary | ICD-10-CM

## 2021-05-28 DIAGNOSIS — I10 Essential (primary) hypertension: Secondary | ICD-10-CM

## 2021-05-28 DIAGNOSIS — N1831 Chronic kidney disease, stage 3a: Secondary | ICD-10-CM

## 2021-05-28 DIAGNOSIS — R0989 Other specified symptoms and signs involving the circulatory and respiratory systems: Secondary | ICD-10-CM

## 2021-05-28 DIAGNOSIS — E781 Pure hyperglyceridemia: Secondary | ICD-10-CM

## 2021-05-28 DIAGNOSIS — E782 Mixed hyperlipidemia: Secondary | ICD-10-CM

## 2021-05-28 DIAGNOSIS — I739 Peripheral vascular disease, unspecified: Secondary | ICD-10-CM

## 2021-05-29 ENCOUNTER — Telehealth: Payer: Self-pay | Admitting: *Deleted

## 2021-05-29 ENCOUNTER — Ambulatory Visit (HOSPITAL_COMMUNITY)
Admission: RE | Admit: 2021-05-29 | Payer: Medicare Other | Source: Ambulatory Visit | Attending: Physician Assistant | Admitting: Physician Assistant

## 2021-05-29 ENCOUNTER — Telehealth: Payer: Self-pay | Admitting: Pharmacist

## 2021-05-29 DIAGNOSIS — E782 Mixed hyperlipidemia: Secondary | ICD-10-CM

## 2021-05-29 LAB — T4, FREE: Free T4: 1.11 ng/dL (ref 0.82–1.77)

## 2021-05-29 LAB — COMPREHENSIVE METABOLIC PANEL
ALT: 17 IU/L (ref 0–32)
AST: 23 IU/L (ref 0–40)
Albumin/Globulin Ratio: 2.1 (ref 1.2–2.2)
Albumin: 5.1 g/dL — ABNORMAL HIGH (ref 3.8–4.8)
Alkaline Phosphatase: 32 IU/L — ABNORMAL LOW (ref 44–121)
BUN/Creatinine Ratio: 25 (ref 12–28)
BUN: 34 mg/dL — ABNORMAL HIGH (ref 8–27)
Bilirubin Total: 0.4 mg/dL (ref 0.0–1.2)
CO2: 19 mmol/L — ABNORMAL LOW (ref 20–29)
Calcium: 10.3 mg/dL (ref 8.7–10.3)
Chloride: 99 mmol/L (ref 96–106)
Creatinine, Ser: 1.35 mg/dL — ABNORMAL HIGH (ref 0.57–1.00)
Globulin, Total: 2.4 g/dL (ref 1.5–4.5)
Glucose: 97 mg/dL (ref 65–99)
Potassium: 4.7 mmol/L (ref 3.5–5.2)
Sodium: 134 mmol/L (ref 134–144)
Total Protein: 7.5 g/dL (ref 6.0–8.5)
eGFR: 44 mL/min/{1.73_m2} — ABNORMAL LOW (ref >59)

## 2021-05-29 LAB — LIPID PANEL
Chol/HDL Ratio: 5.5 ratio — ABNORMAL HIGH (ref 0.0–4.4)
Cholesterol, Total: 176 mg/dL (ref 100–199)
HDL: 32 mg/dL — ABNORMAL LOW (ref >39)
LDL Chol Calc (NIH): 122 mg/dL — ABNORMAL HIGH (ref 0–99)
Triglycerides: 119 mg/dL (ref 0–149)
VLDL Cholesterol Cal: 22 mg/dL (ref 5–40)

## 2021-05-29 LAB — CBC
Hematocrit: 40 % (ref 34.0–46.6)
Hemoglobin: 13.4 g/dL (ref 11.1–15.9)
MCH: 33.8 pg — ABNORMAL HIGH (ref 26.6–33.0)
MCHC: 33.5 g/dL (ref 31.5–35.7)
MCV: 101 fL — ABNORMAL HIGH (ref 79–97)
Platelets: 288 10*3/uL (ref 150–450)
RBC: 3.96 x10E6/uL (ref 3.77–5.28)
RDW: 12.5 % (ref 11.7–15.4)
WBC: 7.6 10*3/uL (ref 3.4–10.8)

## 2021-05-29 LAB — TSH: TSH: 1.82 u[IU]/mL (ref 0.450–4.500)

## 2021-05-29 NOTE — Telephone Encounter (Signed)
Kerry-Anne Sabourin (Key: [id number]) Repatha SureClick 140MG /ML auto-injectors  PA SUBMITTED

## 2021-05-29 NOTE — Telephone Encounter (Signed)
-----   Message from Laurann Montana, [place] sent at 05/29/2021  7:26 AM EDT ----- Please let patient know blood count, thyroid are normal. Labs show chronic kidney disease with slightly higher creatinine than we have on file from last year, also elevated albumin level. Please send results to Dr. Signe Colt to [facility] office and ask them to take a look to see if they want to see her sooner than routine follow-up. Her cholesterol remains too high. Please refer back to the lipid clinic to revisit options for management. Find out how BPs have been running, thank you. Keep plan otherwise as discussed.

## 2021-05-29 NOTE — Telephone Encounter (Signed)
Will route to the Pharm D to get pt back in to revisit Cholesterol options.  Pt aware.   Also will route to Dr. Signe Colt, [facility], to view labs and get pt back in sooner, per Ronie Spies, PA-C.  Pt aware.

## 2021-05-29 NOTE — Telephone Encounter (Signed)
LDL has increased from 80s to 122 secondary to pt stopping rosuvastatin 5mg  3x/week and changing ezetimibe from daily to twice weekly due to myalgias. Previously intolerant to atorvastatin, pravastatin, and Livalo 2-4mg  daily as well (myalgias with all). Repatha previously not covered when I saw pt last, however it looks like since then she has had PAD diagnosis added to her chart which should qualify her for PCSK9i coverage under ASCVD indication. She is agreeable to try Repatha if copay is affordable (does take a few other branded meds). Will reach out to pt once copay information is known. She'll continue on fenofibrate and Vascepa - this is the best her TG have ever looked which she is very pleased about.

## 2021-05-30 MED ORDER — REPATHA SURECLICK 140 MG/ML ~~LOC~~ SOAJ
1.0000 | SUBCUTANEOUS | 11 refills | Status: DC
Start: 2021-05-30 — End: 2022-07-08

## 2021-05-30 NOTE — Addendum Note (Signed)
Addended by: Eather Colas on: 05/30/2021 05:00 PM   Modules accepted: Orders

## 2021-05-30 NOTE — Addendum Note (Signed)
Addended by: Traeger Sultana E on: 05/30/2021 04:36 PM   Modules accepted: Orders

## 2021-05-30 NOTE — Telephone Encounter (Signed)
Called and spoke w/pt and stated that they were approved for repatha. Copay of $35 montly. Ordered and scheduled lipid and hepatic panel pt is a retired Engineer, civil (consulting) and voiced understanding to fast

## 2021-06-06 ENCOUNTER — Other Ambulatory Visit: Payer: Self-pay

## 2021-06-06 ENCOUNTER — Ambulatory Visit (HOSPITAL_COMMUNITY)
Admission: RE | Admit: 2021-06-06 | Discharge: 2021-06-06 | Disposition: A | Discharge status: Home / Self Care | Payer: Medicare Other | Source: Ambulatory Visit | Attending: Cardiology | Admitting: Cardiology

## 2021-06-06 DIAGNOSIS — E781 Pure hyperglyceridemia: Secondary | ICD-10-CM | POA: Diagnosis present

## 2021-06-06 DIAGNOSIS — R011 Cardiac murmur, unspecified: Secondary | ICD-10-CM | POA: Diagnosis present

## 2021-06-06 DIAGNOSIS — N1831 Chronic kidney disease, stage 3a: Secondary | ICD-10-CM

## 2021-06-06 DIAGNOSIS — E782 Mixed hyperlipidemia: Secondary | ICD-10-CM | POA: Diagnosis present

## 2021-06-06 DIAGNOSIS — I10 Essential (primary) hypertension: Secondary | ICD-10-CM | POA: Diagnosis present

## 2021-06-06 DIAGNOSIS — I739 Peripheral vascular disease, unspecified: Secondary | ICD-10-CM | POA: Diagnosis present

## 2021-06-06 DIAGNOSIS — R0989 Other specified symptoms and signs involving the circulatory and respiratory systems: Secondary | ICD-10-CM | POA: Diagnosis present

## 2021-06-07 ENCOUNTER — Telehealth: Payer: Self-pay | Admitting: Cardiology

## 2021-06-07 ENCOUNTER — Other Ambulatory Visit: Payer: Self-pay

## 2021-06-07 DIAGNOSIS — K551 Chronic vascular disorders of intestine: Secondary | ICD-10-CM

## 2021-06-07 NOTE — Telephone Encounter (Signed)
Returned call to pt.  She has been made aware of her Vas Carotid US results.  See result note.

## 2021-06-07 NOTE — Telephone Encounter (Signed)
Pt is returning a call from this morning. Please advise pt further

## 2021-06-10 ENCOUNTER — Telehealth: Payer: Self-pay | Admitting: Pharmacist

## 2021-06-10 NOTE — Telephone Encounter (Signed)
  Pt c/o medication issue:  1. Name of Medication: statin  2. How are you currently taking this medication (dosage and times per day)?   3. Are you having a reaction (difficulty breathing--STAT)?   4. What is your medication issue? Pt said she is no longer taking her statin for a year now, but couple of weeks ago she started having leg problems and doesn't know what causing it, she is requesting a callback from [place]

## 2021-06-10 NOTE — Telephone Encounter (Signed)
Pt takes Repatha, ezetimibe twice weekly, fenofibrate, and Vascepa for her lipids. Repatha started most recently on 05/30/21. She's already intolerant to atorvastatin, pravastatin, and Livalo (myalgias).  Called pt back. She states she started noting aching in her legs and nausea about 3 weeks ago. States she stopped her ezetimibe and hasn't started Repatha yet. Has been taking dicyclomine for a few months to help with diarrhea which can cause nausea but shouldn't be causing leg pain. She's been on fenofibrate since at least 2015 and Vascepa for the past year, I don't think these are contributing. Advised her to reach out to her PCP for follow up, and to start on Repatha once source of aching in her legs has been determined. She was appreciative for the call back.

## 2021-06-13 ENCOUNTER — Other Ambulatory Visit: Payer: Self-pay

## 2021-06-13 ENCOUNTER — Ambulatory Visit (HOSPITAL_COMMUNITY): Payer: Medicare Other | Attending: Physician Assistant

## 2021-06-13 DIAGNOSIS — N1831 Chronic kidney disease, stage 3a: Secondary | ICD-10-CM | POA: Diagnosis present

## 2021-06-13 DIAGNOSIS — E782 Mixed hyperlipidemia: Secondary | ICD-10-CM

## 2021-06-13 DIAGNOSIS — I739 Peripheral vascular disease, unspecified: Secondary | ICD-10-CM | POA: Diagnosis present

## 2021-06-13 DIAGNOSIS — I10 Essential (primary) hypertension: Secondary | ICD-10-CM | POA: Diagnosis present

## 2021-06-13 DIAGNOSIS — E781 Pure hyperglyceridemia: Secondary | ICD-10-CM | POA: Diagnosis present

## 2021-06-13 DIAGNOSIS — R011 Cardiac murmur, unspecified: Secondary | ICD-10-CM | POA: Diagnosis not present

## 2021-06-13 DIAGNOSIS — R0989 Other specified symptoms and signs involving the circulatory and respiratory systems: Secondary | ICD-10-CM | POA: Diagnosis present

## 2021-06-14 LAB — ECHOCARDIOGRAM COMPLETE
Area-P 1/2: 3.42 cm2
S' Lateral: 2.5 cm

## 2021-06-17 ENCOUNTER — Telehealth: Payer: Self-pay | Admitting: Cardiology

## 2021-06-17 NOTE — Telephone Encounter (Signed)
Pt is returning call from earlier this morning 

## 2021-06-17 NOTE — Telephone Encounter (Signed)
The patient has been notified of the echo result and verbalized understanding.  All questions (if any) were answered. Sampson Goon, RN 06/17/2021 12:44 PM

## 2021-06-19 ENCOUNTER — Ambulatory Visit (HOSPITAL_COMMUNITY)
Admission: RE | Admit: 2021-06-19 | Discharge: 2021-06-19 | Disposition: A | Discharge status: Home / Self Care | Payer: Medicare Other | Source: Ambulatory Visit | Attending: Vascular Surgery | Admitting: Vascular Surgery

## 2021-06-19 ENCOUNTER — Other Ambulatory Visit: Payer: Self-pay

## 2021-06-19 ENCOUNTER — Encounter: Payer: Self-pay | Admitting: Vascular Surgery

## 2021-06-19 ENCOUNTER — Ambulatory Visit (INDEPENDENT_AMBULATORY_CARE_PROVIDER_SITE_OTHER): Payer: Medicare Other | Admitting: Vascular Surgery

## 2021-06-19 VITALS — BP 119/76 | HR 50 | Temp 98.2°F | Resp 20 | Ht 68.0 in | Wt 134.0 lb

## 2021-06-19 DIAGNOSIS — I714 Abdominal aortic aneurysm, without rupture, unspecified: Secondary | ICD-10-CM

## 2021-06-19 DIAGNOSIS — K551 Chronic vascular disorders of intestine: Secondary | ICD-10-CM

## 2021-06-19 NOTE — Progress Notes (Signed)
Patient ID: Darlene Saunders, female   DOB: 1956/08/09, 65 y.o.   MRN: [id number]  Reason for Consult: New Patient (Initial Visit)   Referred by Merri Brunette, MD  Subjective:     HPI:  Darlene Saunders is a 65 y.o. female I have previously evaluated for mesenteric artery stenosis as well as abdominal aortic aneurysm.  She now tells me that since May she has had chronic diarrhea that she is working on with her primary care doctor and is lost approximately 20 pounds.  She denies any abdominal pain.  She does have risk factors for vascular disease hyperlipidemia, hypertension and she continues to smoke occasionally.  Her father did have an aneurysm as well as one other family member.  She has no new back or abdominal pain.  She walks without limitations.  Past Medical History:  Diagnosis Date   AAA (abdominal aortic aneurysm) (HCC)    Aortic atherosclerosis (HCC)    Chronic kidney disease, stage 3a (HCC)    Complete hearing loss of right ear    Diabetes mellitus without complication (HCC)    Type 2    Esophageal reflux    Hyperlipidemia    Hypertension    Hypertriglyceridemia    Migraine    PAD (peripheral artery disease) (HCC)    Family History  Problem Relation Age of Onset   Thyroid disease Mother    Hypertension Mother    Dementia Mother    Hypertension Father    Hyperlipidemia Father    Cancer - Prostate Father    Cancer - Other Maternal Grandmother    Cancer - Other Paternal Grandmother    Hypertension Paternal Grandfather    Past Surgical History:  Procedure Laterality Date   APPENDECTOMY     hysterectomy, removal of right ovary     LAPAROTOMY     ORTHOPEDIC SURGERY      Short Social History:  Social History   Tobacco Use   Smoking status: Some Days    Packs/day: 0.25    Years: 15.00    Pack years: 3.75    Types: Cigarettes   Smokeless tobacco: Never  Substance Use Topics   Alcohol use: Yes    Comment: Pt reports a few glasses of wine a night     Allergies  Allergen Reactions   Dexilant [Dexlansoprazole] Diarrhea   Crestor [Rosuvastatin]     Muscle aches on 5mg  3x/week   Hctz [Hydrochlorothiazide]     Rash   Lipitor [Atorvastatin]     Muscle aches   Livalo [Pitavastatin]     Myalgias on 2-4mg  daily   Penicillins    Pravastatin     Muscle aches on 40mg  daily   Sulfamethoxazole-Trimethoprim Hives   Augmentin [Amoxicillin-Pot Clavulanate] Hives   Cipro Hc [Ciprofloxacin-Hydrocortisone]     Topical reaction    Current Outpatient Medications  Medication Sig Dispense Refill   acyclovir (ZOVIRAX) 400 MG tablet Take 400 mg by mouth 2 (two) times daily.     ALPRAZolam (XANAX) 0.5 MG tablet Take 0.5 mg by mouth at bedtime.      atenolol (TENORMIN) 100 MG tablet Take 100 mg by mouth daily.      Biotin 5000 MCG CAPS Take 1 capsule by mouth daily.     colestipol (COLESTID) 1 g tablet Take 2 g by mouth 2 (two) times daily.     dapagliflozin propanediol (FARXIGA) 10 MG TABS tablet Take 10 mg by mouth daily.     esomeprazole (NEXIUM) 40 MG  capsule Take 40 mg by mouth daily at 12 noon.     estradiol (ESTRACE) 2 MG tablet Take 2 mg by mouth daily.     fenofibrate 160 MG tablet Take 1 tablet (160 mg total) by mouth daily. Please make overdue appt with Dr. Anne Fu before anymore refills. Thank you 1st attempt 90 tablet 0   fexofenadine (ALLEGRA) 180 MG tablet Take 180 mg by mouth as needed for allergies or rhinitis.     indapamide (LOZOL) 2.5 MG tablet Take 1 tablet (2.5 mg total) by mouth daily. 90 tablet 3   levothyroxine (SYNTHROID, LEVOTHROID) 50 MCG tablet Take 50 mcg by mouth daily before breakfast.      metoCLOPramide (REGLAN) 10 MG tablet Take 10 mg by mouth as needed for nausea (Take one tablet every 4 to 6 hours with migraine headaches as directed for her migrains).     ramipril (ALTACE) 10 MG capsule TAKE 1 CAPSULE(10 MG) BY MOUTH TWICE DAILY 180 capsule 3   sertraline (ZOLOFT) 100 MG tablet Take 100 mg by mouth daily.       spironolactone (ALDACTONE) 50 MG tablet TAKE 1 TABLET(50 MG) BY MOUTH DAILY 90 tablet 0   tiZANidine (ZANAFLEX) 4 MG tablet SMARTSIG:1 Tablet(s) By Mouth     VASCEPA 1 g capsule Take 2 capsules (2 g total) by mouth 2 (two) times daily. 360 capsule 3   Evolocumab (REPATHA SURECLICK) 140 MG/ML SOAJ Inject 1 pen into the skin every 14 (fourteen) days. (Patient not taking: Reported on 06/19/2021) 2 mL 11   No current facility-administered medications for this visit.    Review of Systems  Constitutional: Positive for unexpected weight change.  HENT: HENT negative.  Eyes: Eyes negative.  Respiratory: Respiratory negative.  Cardiovascular: Cardiovascular negative.  GI: Positive for abdominal pain and diarrhea.  Musculoskeletal: Musculoskeletal negative.  Skin: Skin negative.  Neurological: Neurological negative. Hematologic: Hematologic/lymphatic negative.  Psychiatric: Psychiatric negative.       Objective:  Objective   Vitals:   06/19/21 0846  BP: 119/76  Pulse: (!) 50  Resp: 20  Temp: 98.2 F (36.8 C)  SpO2: 98%  Weight: 134 lb (60.8 kg)  Height: 5\' 8"  (1.727 m)   Body mass index is 20.37 kg/m.  Physical Exam HENT:     Head: Normocephalic.     Nose:     Comments: Wearing a mask Eyes:     Pupils: Pupils are equal, round, and reactive to light.  Neck:     Vascular: No carotid bruit.  Cardiovascular:     Rate and Rhythm: Normal rate.     Pulses:          Popliteal pulses are 2+ on the right side and 2+ on the left side.  Pulmonary:     Effort: Pulmonary effort is normal.  Abdominal:     General: Abdomen is flat.     Palpations: Abdomen is soft.  Musculoskeletal:        General: No swelling. Normal range of motion.     Cervical back: Normal range of motion and neck supple.  Skin:    General: Skin is warm and dry.     Capillary Refill: Capillary refill takes less than 2 seconds.  Neurological:     General: No focal deficit present.     Mental Status: She is  alert.  Psychiatric:        Mood and Affect: Mood normal.        Behavior: Behavior normal.  Thought Content: Thought content normal.        Judgment: Judgment normal.    Data: Duplex Findings:  +----------------------+--------+--------+--------+------------------------  --+  Mesenteric            PSV cm/sEDV cm/s Plaque          Comments            +----------------------+--------+--------+--------+------------------------  --+  Aorta at Celiac          56      9                                          +----------------------+--------+--------+--------+------------------------  --+  Aorta Mid                52      0            3.18 cm x 3.44 cm  diameter  +----------------------+--------+--------+--------+------------------------  --+  Aorta Distal             89      0                                          +----------------------+--------+--------+--------+------------------------  --+  Celiac Artery Origin    263                                                 +----------------------+--------+--------+--------+------------------------  --+  Celiac Artery Proximal  147      36            post stenotic  turbulence   +----------------------+--------+--------+--------+------------------------  --+  SMA Origin              270      55                                         +----------------------+--------+--------+--------+------------------------  --+  SMA Proximal            279      52   calcific                              +----------------------+--------+--------+--------+------------------------  --+  SMA Mid                 275      54                                         +----------------------+--------+--------+--------+------------------------  --+  CHA                     239      65            post stenotic  turbulence    +----------------------+--------+--------+--------+------------------------  --+  Splenic                 199      29  post stenotic  turbulence   +----------------------+--------+--------+--------+------------------------  --+  IMA                     234      34                                         +----------------------+--------+--------+--------+------------------------  --+      Mesenteric Technologist observations: Prior duplex 05/23/2020 showed an  AAA with a maximum diameter of 3.2 cm.              Summary:  Mesenteric:  70 to 99% stenosis in the superior mesenteric artery and celiac artery.  The  Inferior Mesenteric artery appears stenotic.  No significant change in AAA diameter.         Assessment/Plan:    65 year old female with three-vessel mesenteric artery stenosis and small abdominal aortic aneurysm.  I discussed with her that this is the unlikely cause of her diarrhea however given that she does have 3 vessels and weight loss we could consider intervention versus continued noninvasive monitoring.  At this time she would like to proceed with CT angio to evaluate and possible consider endovascular intervention if the blockages are severe.  We will have her back in approximately 1 month with a CT for further evaluation peer     Maeola Harman MD [facility]

## 2021-06-20 ENCOUNTER — Other Ambulatory Visit: Payer: Self-pay

## 2021-06-20 DIAGNOSIS — K551 Chronic vascular disorders of intestine: Secondary | ICD-10-CM

## 2021-07-04 ENCOUNTER — Ambulatory Visit (HOSPITAL_COMMUNITY): Payer: Medicare Other

## 2021-07-09 ENCOUNTER — Other Ambulatory Visit: Payer: Self-pay | Admitting: Cardiology

## 2021-07-10 ENCOUNTER — Other Ambulatory Visit: Payer: Self-pay | Admitting: Cardiology

## 2021-07-12 ENCOUNTER — Other Ambulatory Visit: Payer: Self-pay | Admitting: Cardiology

## 2021-07-18 ENCOUNTER — Encounter (HOSPITAL_COMMUNITY): Payer: Self-pay

## 2021-07-18 ENCOUNTER — Ambulatory Visit (HOSPITAL_COMMUNITY)
Admission: RE | Admit: 2021-07-18 | Discharge: 2021-07-18 | Disposition: A | Discharge status: Home / Self Care | Payer: Medicare Other | Source: Ambulatory Visit | Attending: Vascular Surgery | Admitting: Vascular Surgery

## 2021-07-18 ENCOUNTER — Other Ambulatory Visit: Payer: Self-pay

## 2021-07-18 DIAGNOSIS — K551 Chronic vascular disorders of intestine: Secondary | ICD-10-CM | POA: Insufficient documentation

## 2021-07-18 LAB — POCT I-STAT CREATININE: Creatinine, Ser: 1.7 mg/dL — ABNORMAL HIGH (ref 0.44–1.00)

## 2021-07-18 MED ORDER — IOHEXOL 350 MG/ML SOLN
75.0000 mL | Freq: Once | INTRAVENOUS | Status: AC | PRN
  Administered 2021-07-18: 75 mL via INTRAVENOUS

## 2021-07-18 NOTE — Progress Notes (Signed)
Cardiology Office Note    Date:  07/22/2021   ID:  Darlene Saunders, DOB 1955-11-11, MRN [id number]  PCP:  Carol Ada, MD  Cardiologist:  Candee Furbish, MD  Electrophysiologist:  None   Chief Complaint: f/u testing, HLD  History of Present Illness:   Darlene Saunders is a 65 y.o. female with history of prior public health nurse with history of PAD, AAA, HLD with severe hypertriglyceridemia, chronic headaches, back pain, CKD IIIa (followed by Dr. Hollie Salk), cataracts, DM, esophageal reflux who presents for follow-up.    Per chart review it appears she has primarily been followed by our clinic for risk factor modification. She had a prior AAA duplex in 2019 without evidence for aneurysm but did demonstrate mesenteric and celiac stenosis so was recommended to see [facility] but this did not occur at that time. Renal duplex 05/2020 showed 3.2cm AAA + aortic atherosclerosis, bilateral renal cysts, no RAS. She has been followed in our pharmD clinic for both her HTN and HLD. Regarding HTN management, she has had prior rash with HCTZ per pharmD note but has tolerated indapamide. he has been previously intolerant to multiple statins including rosuvastatin (even low dose), atorvastatin, pravastatin, livalo (myalgias with all), and Zetia. Her insurance would not previously cover PCSK9i but did cover Vascepa - I recently referred her back to pharm to revisit PCSK9i due to dx of PAD.  When I met her in clinic in 04/2021, she was feeling well from a cardiac standpoint but had been following with GI for several months of abdominal pain and diarrhea. She had a heart murmur and left subclavian bruit. She had no symptoms to suggest subclavian steal.  I referred her to our pharmD team to revisit PCSK9i therapy, referred to [facility], and ordered echo and carotid study. Carotid duplex 06/06/21 showed 1-39% BICA, right subclavian artery stenotic but bilateral flow disturbed, bilateral vertebrals with antegrade flow. 2D echo 06/13/21 EF  60-65%, moderate asymmetric hypertrophy of the basal-septal segment, mild LAE, moderate-severe MAC, mild aortic sclerosis without stenosis. Our pharmacist assisted in getting her started on Tillar. She saw Dr. Donzetta Matters with [facility] and mesenteric duplex showed 70-99% stenosis in the SMA and celiac artery as well as stenotic inferior mesenteric artery, AAA maximum diameter 3.2cm. CT 07/18/21 showed 3.2cm AAA, similar appearing moderate ostial stenosis of the celiac artery, patent SMA/IMA, no evidence of mesenteric ischemia, nonspecific use gastric wall thickening, most prominent in the fundus, few scattered sigmoid diverticula, unchanged bilateral renal cysts. Result note pending.   She is seen back for follow-up today again stable from cardiac standpoint without any angina, dyspnea or subclavian steal symptoms. She notes her BP is running lower today. Again she has a BP differential in her arms - this is higher in the right arm 114/64, left arm 102/65. Her main complaint is ongoing issues of abdominal pain and weight loss. Since May she has lost almost 30lb. She has f/u in November with Dr. Therisa Doyne. We discussed her CT findings and I encouraged her to contact Dr. Therisa Doyne to discuss perhaps whether her scan warrants endoscopy. She is not yet ready to consider Repatha given all of her GI issues going on.   Labwork independently reviewed: Istat Cr 07/18/21 Cr 1.7 05/2021 thyroid normal, Hgb 13.4, LDL 122, trig 119, Cr 1.35, albumin 5.1, AST/ALT OK   Past Medical History:  Diagnosis Date   AAA (abdominal aortic aneurysm)    Aortic atherosclerosis (HCC)    Chronic kidney disease, stage 3a ([place])  Complete hearing loss of right ear    Diabetes mellitus without complication (HCC)    Type 2    Esophageal reflux    Hyperlipidemia    Hypertension    Hypertriglyceridemia    Migraine    PAD (peripheral artery disease) (HCC)     Past Surgical History:  Procedure Laterality Date   APPENDECTOMY     hysterectomy,  removal of right ovary     LAPAROTOMY     ORTHOPEDIC SURGERY      Current Medications: Current Meds  Medication Sig   acyclovir (ZOVIRAX) 400 MG tablet Take 400 mg by mouth 2 (two) times daily.   ALPRAZolam (XANAX) 0.5 MG tablet Take 0.5 mg by mouth at bedtime.    atenolol (TENORMIN) 100 MG tablet Take 100 mg by mouth daily.    Biotin 5000 MCG CAPS Take 1 capsule by mouth daily.   colestipol (COLESTID) 1 g tablet Take 2 g by mouth 2 (two) times daily.   dapagliflozin propanediol (FARXIGA) 10 MG TABS tablet Take 10 mg by mouth daily.   esomeprazole (NEXIUM) 40 MG capsule Take 40 mg by mouth daily at 12 noon.   estradiol (ESTRACE) 2 MG tablet Take 2 mg by mouth daily.   Evolocumab (REPATHA SURECLICK) 540 MG/ML SOAJ Inject 1 pen into the skin every 14 (fourteen) days.   fenofibrate 160 MG tablet TAKE 1 TABLET BY MOUTH  DAILY   fexofenadine (ALLEGRA) 180 MG tablet Take 180 mg by mouth as needed for allergies or rhinitis.   icosapent Ethyl (VASCEPA) 1 g capsule TAKE 2 CAPSULES(2 GRAMS) BY MOUTH TWICE DAILY   indapamide (LOZOL) 2.5 MG tablet Take 1 tablet (2.5 mg total) by mouth daily.   levothyroxine (SYNTHROID, LEVOTHROID) 50 MCG tablet Take 50 mcg by mouth daily before breakfast.    metoCLOPramide (REGLAN) 10 MG tablet Take 10 mg by mouth as needed for nausea (Take one tablet every 4 to 6 hours with migraine headaches as directed for her migrains).   ramipril (ALTACE) 10 MG capsule TAKE 1 CAPSULE(10 MG) BY MOUTH TWICE DAILY   sertraline (ZOLOFT) 100 MG tablet Take 100 mg by mouth daily.    spironolactone (ALDACTONE) 50 MG tablet TAKE 1 TABLET(50 MG) BY MOUTH DAILY   tiZANidine (ZANAFLEX) 4 MG tablet SMARTSIG:1 Tablet(s) By Mouth     Allergies:   Dexilant [dexlansoprazole], Crestor [rosuvastatin], Hctz [hydrochlorothiazide], Lipitor [atorvastatin], Livalo [pitavastatin], Penicillins, Pravastatin, Sulfamethoxazole-trimethoprim, Augmentin [amoxicillin-pot clavulanate], and Cipro hc  [ciprofloxacin-hydrocortisone]   Social History   Socioeconomic History   Marital status: Married    Spouse name: Not on file   Number of children: Not on file   Years of education: Not on file   Highest education level: Not on file  Occupational History   Not on file  Tobacco Use   Smoking status: Some Days    Packs/day: 0.25    Years: 15.00    Pack years: 3.75    Types: Cigarettes   Smokeless tobacco: Never  Vaping Use   Vaping Use: Never used  Substance and Sexual Activity   Alcohol use: Yes    Comment: Pt reports a few glasses of wine a night   Drug use: No   Sexual activity: Not on file  Other Topics Concern   Not on file  Social History Narrative   Not on file   Social Determinants of Health   Financial Resource Strain: Not on file  Food Insecurity: Not on file  Transportation Needs: Not on file  Physical Activity: Not on file  Stress: Not on file  Social Connections: Not on file     Family History:  The patient's family history includes Cancer - Other in her maternal grandmother and paternal grandmother; Cancer - Prostate in her father; Dementia in her mother; Hyperlipidemia in her father; Hypertension in her father, mother, and paternal grandfather; Thyroid disease in her mother.  ROS:   Please see the history of present illness.  All other systems are reviewed and otherwise negative.    EKGs/Labs/Other Studies Reviewed:    Studies reviewed are outlined and summarized above. Reports included below if pertinent.  2D echo 06/13/21  1. Left ventricular ejection fraction, by estimation, is 60 to 65%. The  left ventricle has normal function. The left ventricle has no regional  wall motion abnormalities. There is moderate asymmetric left ventricular  hypertrophy of the basal-septal  segment. Left ventricular diastolic parameters are indeterminate. The  average left ventricular global longitudinal strain is -24.3 %. The global  longitudinal strain is  normal.   2. Right ventricular systolic function is normal. The right ventricular  size is normal. There is normal pulmonary artery systolic pressure.   3. Left atrial size was mildly dilated.   4. The mitral valve is normal in structure. Trivial mitral valve  regurgitation. No evidence of mitral stenosis. Moderate to severe mitral  annular calcification.   5. The aortic valve is tricuspid. There is mild calcification of the  aortic valve. There is mild thickening of the aortic valve. Aortic valve  regurgitation is trivial. Mild aortic valve sclerosis is present, with no  evidence of aortic valve stenosis.   6. The inferior vena cava is normal in size with greater than 50%  respiratory variability, suggesting right atrial pressure of 3 mmHg.   Comparison(s): No prior Echocardiogram.   Conclusion(s)/Recommendation(s): Otherwise normal echocardiogram, with  minor abnormalities described in the report.   Carotid 05/2021  Summary:  Right Carotid: Velocities in the right ICA are consistent with a 1-39%  stenosis.                 Non-hemodynamically significant plaque <50% noted in the  CCA.   Left Carotid: Velocities in the left ICA are consistent with a 1-39%  stenosis.                Non-hemodynamically significant plaque <50% noted in the  CCA.   Vertebrals:  Bilateral vertebral arteries demonstrate antegrade flow.  Subclavians: Right subclavian artery was stenotic. Bilateral subclavian  artery               flow was disturbed.   *See table(s) above for measurements and observations.   Renal artery duplex 05/2020 IMPRESSION: 1. Abdominal aorta has diffuse atherosclerotic disease. Aortic Atherosclerosis (ICD10-I70.0). 2. Bilateral renal arteries are patent. No significant renal artery stenosis. 3. Abdominal aortic aneurysm measuring 3.2 cm. Recommend follow-up every 3 years. This recommendation follows ACR consensus guidelines: White Paper of the ACR Incidental Findings  Committee II on Vascular Findings. J Am Coll Radiol 2013; 10:789-794. 4. Bilateral renal cysts with two indeterminate structures in the left kidney. Recommend further characterization with renal MRI, with and without contrast. Based on patient's lab values from May 2021, patient should be a candidate for post contrast MRI. 5. Indeterminate echogenic focus in the right kidney. Cannot exclude a nonobstructive kidney stone.   These results will be called to the ordering clinician or representative by the Radiologist Assistant, and communication documented in the PACS  or [employer].   AAA duplex 2019  Final Interpretation:   Abdominal Aorta: No evidence of an abdominal aortic aneurysm was  visualized.   The largest aortic measurement is 2.0 cm.   Stenosis:  >70% stenosis in the celiac, superior mesenteric and inferior mesenteric  arteries.     *See table(s) above for measurements and observations.     Electronically signed by Ida Rogue on 10/23/2017 at 8:56:27 PM.     EKG:  EKG is not ordered today  Recent Labs: 05/28/2021: ALT 17; BUN 34; Hemoglobin 13.4; Platelets 288; Potassium 4.7; Sodium 134; TSH 1.820 07/18/2021: Creatinine, Ser 1.70  Recent Lipid Panel    Component Value Date/Time   CHOL 176 05/28/2021 1310   CHOL 239 (H) 06/14/2015 0936   TRIG 119 05/28/2021 1310   TRIG 304 (H) 06/14/2015 0936   TRIG 923 (HH) 10/05/2006 0825   HDL 32 (L) 05/28/2021 1310   HDL 18 (L) 06/14/2015 0936   CHOLHDL 5.5 (H) 05/28/2021 1310   CHOLHDL 15.1 (H) 06/16/2016 0839   VLDL 68 (H) 06/16/2016 0839   LDLCALC 122 (H) 05/28/2021 1310   LDLCALC 160 (H) 06/14/2015 0936   LDLDIRECT 84 10/23/2020 0830   LDLDIRECT 91.2 02/02/2009 0821    PHYSICAL EXAM:    VS:  BP 114/64   Pulse 66   Ht _0  (1.676 m)   Wt 129 lb (58.5 kg)   SpO2 99%   BMI 20.82 kg/m   BMI: Body mass index is 20.82 kg/m.  GEN: Well nourished, well developed female in no acute distress HEENT:  normocephalic, atraumatic Neck: no JVD, carotid bruits, or masses, + left subclavian bruit Cardiac: RRR; no murmurs, rubs, or gallops, no edema  Respiratory:  clear to auscultation bilaterally, normal work of breathing GI: soft, nontender, nondistended, + BS MS: no deformity or atrophy Skin: warm and dry, no rash Neuro:  Alert and Oriented x 3, Strength and sensation are intact, follows commands Psych: euthymic mood, full affect  Wt Readings from Last 3 Encounters:  07/22/21 129 lb (58.5 kg)  06/19/21 134 lb (60.8 kg)  05/21/21 137 lb 3.2 oz (62.2 kg)     ASSESSMENT & PLAN:   1. Hyperlipidemia, hypertriglyceridemia, with history of statin myopathy - patient wishes to defer Repatha at this time given that she has so much else going on from a GI standpoint. I am hopeful she can get on this when she is ready. We will cancel the upcoming lab visit for LFTs/lipids for now. She will otherwise continue her other regimen of fenofibrate and Vascepa. I asked her to call our office when she starts this so we can check follow-up labs.  2. Essential HTN - BP running slightly lower than usual. IStat Cr by VVS was also higher than prior. We will stop indapamide. Her BP is higher in the right arm 114/64, left arm 102/65. Her right subclavian was described as stenotic but both had disturbed flow. Left arm has the subclavian bruit. We will consider the right arm to be more accurate. See #4 for additional thoughts on renal function.  3. PAD (mesenteric and celiac stenosis, AAA, subclavian disease) - continue follow-up with [facility]VS as planned. No subclavian steal symptoms.  4. AKI superimposed CKD stage IIIa - recent Cr 05/2021 was 1.35, then 1.70 by iStat 07/18/21. Will stop indapamide. Recheck BMET today and plan to have MA call results to Dr. Hollie Salk as well. Continue spironolactone with caution for now but anticipate she'll need closer renal  follow-up given recent uptrends.  5. Heart murmur - likely due to mitral  and aortic calcification on echo. No significant valvular disease. Murmur not prominent on exam today.   Disposition: F/u with Dr. Marlou Porch in 1 year. She also remains very concerned about her weight loss and GI symptoms. I relayed CT results t her. She has f/u in November with Dr. Therisa Doyne. We discussed her CT findings and I encouraged her to contact Dr. Therisa Doyne to discuss perhaps whether her scan warrants endoscopy sooner.    Medication Adjustments/Labs and Tests Ordered: Current medicines are reviewed at length with the patient today.  Concerns regarding medicines are outlined above. Medication changes, Labs and Tests ordered today are summarized above and listed in the Patient Instructions accessible in Encounters.   Signed, Charlie Pitter, PA-C  07/22/2021 2:56 PM    [facility] [address], [place], [place]  [address] Phone: [phone]; Fax: [phone]

## 2021-07-22 ENCOUNTER — Other Ambulatory Visit: Payer: Self-pay

## 2021-07-22 ENCOUNTER — Other Ambulatory Visit: Payer: Self-pay | Admitting: Cardiology

## 2021-07-22 ENCOUNTER — Encounter: Payer: Self-pay | Admitting: Physician Assistant

## 2021-07-22 ENCOUNTER — Ambulatory Visit (INDEPENDENT_AMBULATORY_CARE_PROVIDER_SITE_OTHER): Payer: Medicare Other | Admitting: Physician Assistant

## 2021-07-22 VITALS — BP 114/64 | HR 66 | Ht 66.0 in | Wt 129.0 lb

## 2021-07-22 DIAGNOSIS — I10 Essential (primary) hypertension: Secondary | ICD-10-CM | POA: Diagnosis not present

## 2021-07-22 DIAGNOSIS — N189 Chronic kidney disease, unspecified: Secondary | ICD-10-CM

## 2021-07-22 DIAGNOSIS — I739 Peripheral vascular disease, unspecified: Secondary | ICD-10-CM | POA: Diagnosis not present

## 2021-07-22 DIAGNOSIS — G720 Drug-induced myopathy: Secondary | ICD-10-CM

## 2021-07-22 DIAGNOSIS — E782 Mixed hyperlipidemia: Secondary | ICD-10-CM | POA: Diagnosis not present

## 2021-07-22 DIAGNOSIS — N179 Acute kidney failure, unspecified: Secondary | ICD-10-CM

## 2021-07-22 DIAGNOSIS — T466X5D Adverse effect of antihyperlipidemic and antiarteriosclerotic drugs, subsequent encounter: Secondary | ICD-10-CM

## 2021-07-22 DIAGNOSIS — R011 Cardiac murmur, unspecified: Secondary | ICD-10-CM

## 2021-07-22 NOTE — Patient Instructions (Addendum)
Medication Instructions:  Your physician has recommended you make the following change in your medication:   STOP Indapamide     *If you need a refill on your cardiac medications before your next appointment, please call your pharmacy*   Lab Work: TODAY:  BMET  If you have labs (blood work) drawn today and your tests are completely normal, you will receive your results only by: MyChart Message (if you have MyChart) OR A paper copy in the mail If you have any lab test that is abnormal or we need to change your treatment, we will call you to review the results.   Testing/Procedures: None ordered   Follow-Up: At [facility], you and your health needs are our priority.  As part of our continuing mission to provide you with exceptional heart care, we have created designated Provider Care Teams.  These Care Teams include your primary Cardiologist (physician) and Advanced Practice Providers (APPs -  Physician Assistants and Nurse Practitioners) who all work together to provide you with the care you need, when you need it.  We recommend signing up for the patient portal called "MyChart".  Sign up information is provided on this After Visit Summary.  MyChart is used to connect with patients for Virtual Visits (Telemedicine).  Patients are able to view lab/test results, encounter notes, upcoming appointments, etc.  Non-urgent messages can be sent to your provider as well.   To learn more about what you can do with MyChart, go to [email or web address].    Your next appointment:   12 month(s)  The format for your next appointment:   In Person  Provider:   You may see Donato Schultz, MD or one of the following Advanced Practice Providers on your designated Care Team:   Nada Boozer, NP   Other Instructions Call us when you are ready to start Repatha

## 2021-07-23 ENCOUNTER — Telehealth: Payer: Self-pay | Admitting: Physician Assistant

## 2021-07-23 LAB — BASIC METABOLIC PANEL
BUN/Creatinine Ratio: 24 (ref 12–28)
BUN: 42 mg/dL — ABNORMAL HIGH (ref 8–27)
CO2: 16 mmol/L — ABNORMAL LOW (ref 20–29)
Calcium: 9 mg/dL (ref 8.7–10.3)
Chloride: 106 mmol/L (ref 96–106)
Creatinine, Ser: 1.72 mg/dL — ABNORMAL HIGH (ref 0.57–1.00)
Glucose: 91 mg/dL (ref 70–99)
Potassium: 4.4 mmol/L (ref 3.5–5.2)
Sodium: 136 mmol/L (ref 134–144)
eGFR: 33 mL/min/{1.73_m2} — ABNORMAL LOW (ref >59)

## 2021-07-23 NOTE — Telephone Encounter (Signed)
   Morrie Sheldon with [facility] calling, she wanted to let Victorino Dike know that pt is scheduled to see NP Georgette Dover on 07/25/21 at 1 pm

## 2021-07-24 NOTE — Telephone Encounter (Signed)
Thank you :)

## 2021-07-30 ENCOUNTER — Other Ambulatory Visit: Payer: Medicare Other

## 2021-08-07 ENCOUNTER — Encounter: Payer: Self-pay | Admitting: Vascular Surgery

## 2021-08-07 ENCOUNTER — Ambulatory Visit (INDEPENDENT_AMBULATORY_CARE_PROVIDER_SITE_OTHER): Payer: Medicare Other | Admitting: Vascular Surgery

## 2021-08-07 ENCOUNTER — Other Ambulatory Visit: Payer: Self-pay

## 2021-08-07 VITALS — BP 136/72 | HR 54 | Temp 97.9°F | Resp 20 | Ht 66.0 in | Wt 126.0 lb

## 2021-08-07 DIAGNOSIS — I7143 Infrarenal abdominal aortic aneurysm, without rupture: Secondary | ICD-10-CM

## 2021-08-07 DIAGNOSIS — K551 Chronic vascular disorders of intestine: Secondary | ICD-10-CM | POA: Diagnosis not present

## 2021-08-07 NOTE — Progress Notes (Signed)
Patient ID: Darlene Saunders, female   DOB: [birth year], 65 y.o.   MRN: [id number]  Reason for Consult: No chief complaint on file.   Referred by Carol Ada, MD  Subjective:     HPI:  Darlene Saunders is a 65 y.o. female has a history of ongoing weight loss and diarrhea.  She is set to see GI.  She does not have previous vascular disease but does have a known aortic aneurysm.  She is a current sometimes smoker.  She also has chronic kidney disease.  She does not have any new abdominal or back pain.  She has been evaluated chronic mesenteric ischemia and we elected for CT angio prior to any interventional procedures.  She is now here to follow-up with CT results.  Past Medical History:  Diagnosis Date   AAA (abdominal aortic aneurysm)    Aortic atherosclerosis (HCC)    Chronic kidney disease, stage 3a (HCC)    Complete hearing loss of right ear    Diabetes mellitus without complication (HCC)    Type 2    Esophageal reflux    Hyperlipidemia    Hypertension    Hypertriglyceridemia    Migraine    PAD (peripheral artery disease) ([place])    Family History  Problem Relation Age of Onset   Thyroid disease Mother    Hypertension Mother    Dementia Mother    Hypertension Father    Hyperlipidemia Father    Cancer - Prostate Father    Cancer - Other Maternal Grandmother    Cancer - Other Paternal Grandmother    Hypertension Paternal Grandfather    Past Surgical History:  Procedure Laterality Date   APPENDECTOMY     hysterectomy, removal of right ovary     LAPAROTOMY     ORTHOPEDIC SURGERY      Short Social History:  Social History   Tobacco Use   Smoking status: Some Days    Packs/day: 0.25    Years: 15.00    Pack years: 3.75    Types: Cigarettes   Smokeless tobacco: Never  Substance Use Topics   Alcohol use: Yes    Comment: Pt reports a few glasses of wine a night    Allergies  Allergen Reactions   Dexilant [Dexlansoprazole] Diarrhea   Crestor [Rosuvastatin]      Muscle aches on 5mg  3x/week   Hctz [Hydrochlorothiazide]     Rash   Lipitor [Atorvastatin]     Muscle aches   Livalo [Pitavastatin]     Myalgias on 2-4mg  daily   Penicillins    Pravastatin     Muscle aches on 40mg  daily   Sulfamethoxazole-Trimethoprim Hives   Augmentin [Amoxicillin-Pot Clavulanate] Hives   Cipro Hc [Ciprofloxacin-Hydrocortisone]     Topical reaction    Current Outpatient Medications  Medication Sig Dispense Refill   acyclovir (ZOVIRAX) 400 MG tablet Take 400 mg by mouth 2 (two) times daily.     ALPRAZolam (XANAX) 0.5 MG tablet Take 0.5 mg by mouth at bedtime.      atenolol (TENORMIN) 100 MG tablet Take 100 mg by mouth daily.      Biotin 5000 MCG CAPS Take 1 capsule by mouth daily.     colestipol (COLESTID) 1 g tablet Take 2 g by mouth 2 (two) times daily.     dapagliflozin propanediol (FARXIGA) 10 MG TABS tablet Take 10 mg by mouth daily.     esomeprazole (NEXIUM) 40 MG capsule Take 40 mg by mouth daily at 12  noon.     estradiol (ESTRACE) 2 MG tablet Take 2 mg by mouth daily.     Evolocumab (REPATHA SURECLICK) [id number] MG/ML SOAJ Inject 1 pen into the skin every 14 (fourteen) days. 2 mL 11   fenofibrate 160 MG tablet TAKE 1 TABLET BY MOUTH  DAILY 90 tablet 3   fexofenadine (ALLEGRA) 180 MG tablet Take 180 mg by mouth as needed for allergies or rhinitis.     icosapent Ethyl (VASCEPA) 1 g capsule TAKE 2 CAPSULES(2 GRAMS) BY MOUTH TWICE DAILY 360 capsule 3   levothyroxine (SYNTHROID, LEVOTHROID) 50 MCG tablet Take 50 mcg by mouth daily before breakfast.      metoCLOPramide (REGLAN) 10 MG tablet Take 10 mg by mouth as needed for nausea (Take one tablet every 4 to 6 hours with migraine headaches as directed for her migrains).     ramipril (ALTACE) 10 MG capsule TAKE 1 CAPSULE(10 MG) BY MOUTH TWICE DAILY 180 capsule 3   sertraline (ZOLOFT) 100 MG tablet Take 100 mg by mouth daily.      spironolactone (ALDACTONE) 50 MG tablet TAKE 1 TABLET(50 MG) BY MOUTH DAILY 90 tablet 3    tiZANidine (ZANAFLEX) 4 MG tablet SMARTSIG:1 Tablet(s) By Mouth     No current facility-administered medications for this visit.    Review of Systems  Constitutional: Positive for unexpected weight change.  HENT: HENT negative.  Eyes: Eyes negative.  Respiratory: Respiratory negative.  Cardiovascular: Cardiovascular negative.  GI: Positive for abdominal pain and diarrhea.  Musculoskeletal: Musculoskeletal negative.  Skin: Skin negative.  Neurological: Neurological negative. Hematologic: Hematologic/lymphatic negative.  Psychiatric: Psychiatric negative.       Objective:  Objective  Vitals:   08/07/21 1206  BP: 136/72  Pulse: (!) 54  Resp: 20  Temp: 97.9 F (36.6 C)  SpO2: 99%      Physical Exam HENT:     Head: Normocephalic.     Nose:     Comments: Wearing a mask Eyes:     Pupils: Pupils are equal, round, and reactive to light.  Cardiovascular:     Rate and Rhythm: Normal rate.  Pulmonary:     Effort: Pulmonary effort is normal.  Abdominal:     General: Abdomen is flat.     Palpations: Abdomen is soft. There is no mass.  Musculoskeletal:        General: Normal range of motion.     Cervical back: Normal range of motion and neck supple.     Right lower leg: No edema.     Left lower leg: No edema.  Skin:    General: Skin is warm and dry.     Capillary Refill: Capillary refill takes less than 2 seconds.  Neurological:     General: No focal deficit present.     Mental Status: She is alert.  Psychiatric:        Mood and Affect: Mood normal.        Behavior: Behavior normal.        Thought Content: Thought content normal.    Data: CT IMPRESSION: VASCULAR   1. Interval enlargement of fusiform infrarenal abdominal aortic aneurysm from 2.8 cm to 3.2 cm since 2019. Recommend follow-up ultrasound every 3 years. This recommendation follows ACR consensus guidelines: White Paper of the ACR Incidental Findings Committee II on Vascular Findings. J Am Coll Radiol  2013; 10:789-794. 2. Similar appearing moderate ostial stenosis of the celiac artery. The superior and inferior mesenteric arteries are patent. No evidence of mesenteric  ischemia.   NON-VASCULAR   1. Nonspecific, similar appearing diffuse gastric wall thickening, most prominent in the fundus. Upper endoscopic evaluation could be considered if not already performed. 2. Few scattered sigmoid diverticula without evidence of diverticulitis. 3. Unchanged bilateral renal cysts.       Assessment/Plan:     65 year old female thought to have chronic mesenteric ischemia by duplex now with CT scan that demonstrated some calcification around the celiac artery but her SMA appears patent her IMA is also patent although is at the level of the aneurysm and may have ostial disease.  Either way her symptoms do not really fit with mesenteric ischemia and with her SMA being patent with her celiac artery being patent I do not think that she merits any intervention at this time.  She will follow-up in 2 years with abdominal aortic aneurysm duplex.     Maeola Harman MD [facility]

## 2022-01-17 DIAGNOSIS — F419 Anxiety disorder, unspecified: Secondary | ICD-10-CM | POA: Diagnosis present

## 2022-01-17 DIAGNOSIS — E039 Hypothyroidism, unspecified: Secondary | ICD-10-CM | POA: Diagnosis present

## 2022-03-03 ENCOUNTER — Other Ambulatory Visit: Payer: Self-pay | Admitting: Cardiology

## 2022-03-06 ENCOUNTER — Other Ambulatory Visit: Payer: Self-pay | Admitting: Cardiology

## 2022-03-24 ENCOUNTER — Other Ambulatory Visit: Payer: Self-pay | Admitting: Cardiology

## 2022-03-26 NOTE — Telephone Encounter (Signed)
This medication was discontinued 07/22/2021 d/t hypotension.

## 2022-04-26 ENCOUNTER — Other Ambulatory Visit: Payer: Self-pay | Admitting: Cardiology

## 2022-06-25 ENCOUNTER — Telehealth (HOSPITAL_COMMUNITY): Payer: Self-pay | Admitting: Cardiology

## 2022-06-25 DIAGNOSIS — I6529 Occlusion and stenosis of unspecified carotid artery: Secondary | ICD-10-CM

## 2022-06-25 DIAGNOSIS — H34212 Partial retinal artery occlusion, left eye: Secondary | ICD-10-CM

## 2022-06-25 MED ORDER — ASPIRIN 81 MG PO TBEC: 81.0000 mg | DELAYED_RELEASE_TABLET | Freq: Every day | ORAL | 3 refills | Status: DC

## 2022-06-25 NOTE — Telephone Encounter (Signed)
I spoke with Dr Ellie Lunch at [facility].  She did exam on patient and patient has 2 cholesterol plaques in left eye.  Patient did not start cholesterol medication.  Dr Ellie Lunch reports patient should have carotid doppler and office visit with cardiology.  She will send over office note.   Will forward to Dr Marlou Porch to see if OK to order carotid doppler.

## 2022-06-25 NOTE — Telephone Encounter (Signed)
Reviewed with Dr Klein(office DOD) as Dr Marlou Porch is not in the office this afternoon.  Per Dr Caryl Comes it is OK to order carotid doppler.  Patient should start ASA 81 mg daily and have office visit to follow up and discuss starting cholesterol medication.

## 2022-06-25 NOTE — Telephone Encounter (Signed)
New Message:    Dr Ellie Lunch is asking to speak to Dr Kingsley Plan nurse.

## 2022-06-25 NOTE — Telephone Encounter (Signed)
Patient notified. She will start enteric coated ASA 81 mg daily and have carotid doppler study tomorrow. Appointment made for patient to see Richardson Dopp, PA on July 02, 2022.

## 2022-06-26 ENCOUNTER — Ambulatory Visit (HOSPITAL_COMMUNITY)
Admission: RE | Admit: 2022-06-26 | Discharge: 2022-06-26 | Disposition: A | Discharge status: Home / Self Care | Payer: Medicare Other | Source: Ambulatory Visit | Attending: Cardiology | Admitting: Cardiology

## 2022-06-26 DIAGNOSIS — I6529 Occlusion and stenosis of unspecified carotid artery: Secondary | ICD-10-CM

## 2022-06-26 DIAGNOSIS — I6523 Occlusion and stenosis of bilateral carotid arteries: Secondary | ICD-10-CM | POA: Diagnosis not present

## 2022-06-26 DIAGNOSIS — H34212 Partial retinal artery occlusion, left eye: Secondary | ICD-10-CM | POA: Diagnosis not present

## 2022-07-01 ENCOUNTER — Other Ambulatory Visit: Payer: Self-pay | Admitting: Cardiology

## 2022-07-02 ENCOUNTER — Ambulatory Visit: Payer: Medicare Other | Admitting: Physician Assistant

## 2022-07-02 NOTE — Progress Notes (Deleted)
Cardiology Office Note:    Date:  07/02/2022   ID:  Darlene Saunders, DOB 1956-02-07, MRN [id number]  PCP:  Carol Ada, [place] Providers Cardiologist:  Candee Furbish, MD { Click to update primary MD,subspecialty MD or APP then REFRESH:1}    Referring MD: Carol Ada, MD   No chief complaint on file. ***  History of Present Illness:    Darlene Saunders is a 66 y.o. female with a hx of ***  Hypertension Type 2 diabetes History of statin intolerance Hyperlipidemia with severe hypertriglyceridemia PAD AAA CKD stage III AAA Esophageal reflux  Has been following cardiology for risk factor modification.  In 2019 she had a AAA duplex that did not reveal aneurysm but showed mesenteric and celiac artery stenosis, recommended to see [facility] but she was not seen.  Renal artery duplex in 2021 revealed 3.2 cm AAA plus aortic atherosclerosis, no RAS, bilateral renal cysts.  She has been seen by clinical Pharm.D. for hypertension and hyperlipidemia.  Has been unable to tolerate hydrochlorothiazide due to rash but has been able to tolerate Indapamide.  Has a history of statin intolerance to several statins, as well as Zetia.  Difficulty obtaining Repatha/Praluent at that time due to insurance not covering cost. In 2022 it was revealed she had a heart murmur and left subclavian bruit.  Denied symptoms to suggest subclavian steal.  Was referred to clinical Pharm.D. and [facility]VS, echocardiogram and carotid study ordered.  Carotid duplex revealed 1 to 39% BICA, right subclavian artery stenotic but bilateral flow disturbed, antegrade flow noted along bilateral vertebrals.  Echocardiogram showed EF 60-65%, mild LAE, moderate asymmetric hypertrophy of basal-septal segment, moderate-severe MAC, mild aortic sclerosis without stenosis.  Was assisted in getting started on Repatha.  Mesenteric duplex arranged by Dr. Donzetta Matters showed 65 to 99% stenosis in SMA, celiac artery, and stenosis in inferior mesenteric  artery.  Maximum diameter of AAA 3.2 cm.  CT scan in 2022 showed 3.2 cm AAA, with similar appearing moderate ostial celiac artery stenosis, no evidence of mesenteric ischemia, patent SMA/IMA, unchanged bilateral renal cysts, no other acute changes.   Last evaluated by Sharrell Ku, PA on July 22, 2021.  Doing well from a cardiac perspective at the time.  BP differential in her arms, higher in right arm 114/64, then left arm 102/65.  Right arm BP was considered to be more accurate. She had lost 30 pounds since the previous May.  Chief complaint was abdominal pain and weight loss.  Stated she was not ready to consider Repatha due to GI issues. Indapamide stopped due to AKI superimposed CKD stage 3a.   Carotid duplex performed on 06/26/22 and revealed 1-39% BICA with nonhemodynamically significant plaque less than 50% noted in bilateral CCA. Antegrade flow noted in bilateral vertebral arteries with normal flow hemodynamics seen in bilateral subclavian arteries.     Today she presents for regular follow-up and to discuss carotid Doppler study. Ophthalmology wanted her evaluated for plaque in left eye.  HLD, high TG, Statin intolerance Carotid artery disease PAD HTN AAA CKD stage 3a Heart murmur     Past Medical History:  Diagnosis Date   AAA (abdominal aortic aneurysm)    Aortic atherosclerosis (HCC)    Chronic kidney disease, stage 3a (HCC)    Complete hearing loss of right ear    Diabetes mellitus without complication (HCC)    Type 2    Esophageal reflux    Hyperlipidemia    Hypertension  Hypertriglyceridemia    Migraine    PAD (peripheral artery disease) ([place])     Past Surgical History:  Procedure Laterality Date   APPENDECTOMY     hysterectomy, removal of right ovary     LAPAROTOMY     ORTHOPEDIC SURGERY      Current Medications: No outpatient medications have been marked as taking for the 07/02/22 encounter (Appointment) with Richardson Dopp T, PA-C.     Allergies:    Dexilant [dexlansoprazole], Crestor [rosuvastatin], Hctz [hydrochlorothiazide], Levofloxacin, Lipitor [atorvastatin], Livalo [pitavastatin], Penicillins, Pravastatin, Sulfamethoxazole-trimethoprim, Augmentin [amoxicillin-pot clavulanate], and Cipro hc [ciprofloxacin-hydrocortisone]   Social History   Socioeconomic History   Marital status: Married    Spouse name: Not on file   Number of children: Not on file   Years of education: Not on file   Highest education level: Not on file  Occupational History   Not on file  Tobacco Use   Smoking status: Some Days    Packs/day: 0.25    Years: 15.00    Total pack years: 3.75    Types: Cigarettes   Smokeless tobacco: Never  Vaping Use   Vaping Use: Never used  Substance and Sexual Activity   Alcohol use: Yes    Comment: Pt reports a few glasses of wine a night   Drug use: No   Sexual activity: Not on file  Other Topics Concern   Not on file  Social History Narrative   Not on file   Social Determinants of Health   Financial Resource Strain: Not on file  Food Insecurity: Not on file  Transportation Needs: Not on file  Physical Activity: Not on file  Stress: Not on file  Social Connections: Not on file     Family History: The patient's ***family history includes Cancer - Other in her maternal grandmother and paternal grandmother; Cancer - Prostate in her father; Dementia in her mother; Hyperlipidemia in her father; Hypertension in her father, mother, and paternal grandfather; Thyroid disease in her mother.  ROS:   Please see the history of present illness.    *** All other systems reviewed and are negative.  EKGs/Labs/Other Studies Reviewed:    The following studies were reviewed today: ***  EKG:  EKG is *** ordered today.  The ekg ordered today demonstrates ***  Recent Labs: 07/22/2021: BUN 42; Creatinine, Ser 1.72; Potassium 4.4; Sodium 136  Recent Lipid Panel    Component Value Date/Time   CHOL 176 05/28/2021 1310    CHOL 239 (H) 06/14/2015 0936   TRIG 119 05/28/2021 1310   TRIG 304 (H) 06/14/2015 0936   TRIG 923 (HH) 10/05/2006 0825   HDL 32 (L) 05/28/2021 1310   HDL 18 (L) 06/14/2015 0936   CHOLHDL 5.5 (H) 05/28/2021 1310   CHOLHDL 15.1 (H) 06/16/2016 0839   VLDL 68 (H) 06/16/2016 0839   LDLCALC 122 (H) 05/28/2021 1310   LDLCALC 160 (H) 06/14/2015 0936   LDLDIRECT 84 10/23/2020 0830   LDLDIRECT 91.2 02/02/2009 0821     Risk Assessment/Calculations:   {Does this patient have ATRIAL FIBRILLATION?:[id number]}  No BP recorded.  {Refresh Note OR Click here to enter BP  :1}***         Physical Exam:    VS:  There were no vitals taken for this visit.    Wt Readings from Last 3 Encounters:  08/07/21 126 lb (57.2 kg)  07/22/21 129 lb (58.5 kg)  06/19/21 134 lb (60.8 kg)     GEN: *** Well nourished, well  developed in no acute distress HEENT: Normal NECK: No JVD; No carotid bruits LYMPHATICS: No lymphadenopathy CARDIAC: ***RRR, no murmurs, rubs, gallops RESPIRATORY:  Clear to auscultation without rales, wheezing or rhonchi  ABDOMEN: Soft, non-tender, non-distended MUSCULOSKELETAL:  No edema; No deformity  SKIN: Warm and dry NEUROLOGIC:  Alert and oriented x 3 PSYCHIATRIC:  Normal affect   ASSESSMENT:    No diagnosis found. PLAN:    In order of problems listed above:  ***      {Are you ordering a CV Procedure (e.g. stress test, cath, DCCV, TEE, etc)?   Press F2        :[id number]}    Medication Adjustments/Labs and Tests Ordered: Current medicines are reviewed at length with the patient today.  Concerns regarding medicines are outlined above.  No orders of the defined types were placed in this encounter.  No orders of the defined types were placed in this encounter.   There are no Patient Instructions on file for this visit.   Signed, Finis Bud, NP  07/02/2022 5:31 AM    [place]

## 2022-07-07 DIAGNOSIS — H34219 Partial retinal artery occlusion, unspecified eye: Secondary | ICD-10-CM | POA: Insufficient documentation

## 2022-07-07 NOTE — Progress Notes (Unsigned)
Cardiology Office Note:    Date:  07/08/2022   ID:  Darlene Saunders, DOB 06/15/1956, MRN [id number]  PCP:  Darlene Saunders, [place] Providers Cardiologist:  Darlene Furbish, MD     Referring MD: Darlene Ada, MD   Chief Complaint:  Darlene Saunders for Hollenhorst Plaque    Patient Profile: Peripheral arterial disease  Prior US with celiac and mesenteric artery stenosis  Eval w [facility] in 2022: IMA and SMA w 70-99% on [place] (CT w patent celiac, IMA, SMA Renal US in 2021: no Renal artery stenosis  [place] in 05/2021: R subclavian stenosis; bilat subclavian flow disturbed  Carotid artery disease [place] in 9/22: 1-39 bilat ICA  Abdominal aortic aneurysm  [place] in 2021: 3.2 cm  CT in 2022: 3.2 cm  Followed by [facility] - Darlene Saunders  Hyperlipidemia w severe hypertriglyceridemia  Intol of statins  Insurance previously denied PCSK9i Hypertension  Allergy to HCTZ (rash) Chronic kidney disease  Diabetes mellitus  GERD Aortic atherosclerosis  Left eye Hollenhorst plaque Carotid US 10/23: Bilateral ICA 1-39  Prior CV Studies:   Carotid US 06/26/2022 Bilateral ICA 1-39  Abd/Pelvis CTA 07/18/21 IMPRESSION: VASCULAR 1. Interval enlargement of fusiform infrarenal abdominal aortic aneurysm from 2.8 cm to 3.2 cm since 2019.  2. Similar appearing moderate ostial stenosis of the celiac artery. The superior and inferior mesenteric arteries are patent. No evidence of mesenteric ischemia.  Echocardiogram 06/13/21 EF 60-65, no RWMA, moderate asymmetric LVH, GLS -24.3, normal RVSF, normal PASP, mild LAE, trivial MR, moderate to severe MAC, no mitral stenosis, mild AV calcification, trivial AI, mild AV sclerosis without stenosis, RAP 3, RVSP 23.2  Renal Artery [place] 05/23/2020 IMPRESSION: 1. Abdominal aorta has diffuse atherosclerotic disease. Aortic Atherosclerosis (ICD10-I70.0). 2. Bilateral renal arteries are patent. No significant renal artery stenosis. 3. Abdominal aortic aneurysm measuring 3.2 cm.  4.  Bilateral renal cysts with two indeterminate structures in the left kidney. Recommend further characterization with renal MRI, with and without contrast. Based on patient's lab values from May 2021, patient should be a candidate for post contrast MRI. 5. Indeterminate echogenic focus in the right kidney. Cannot exclude a nonobstructive kidney stone.   History of Present Illness:   Darlene Saunders is a 66 y.o. female with the above problem list.  She was last seen in clinic in Oct 2022 by Darlene Saunders. Patient is referred back by Dr. Ellie Saunders for Hollenhorst plaque noted on ophthalmologic exam. F/u carotid US has been done and shows bilateral ICA 1-39% stenosis. She has been started on ASA. Of note, she has intol to statins. She had previous difficulty with coverage for PCSK9 inhibitors through her insurance. At her last visit with [facility], it was noted that she was covered now but the pt preferred to hold off on starting the drug.  She is here alone today. We reviewed her carotid US results. She is doing well w/o chest pain, shortness of breath, syncope, leg edema. She has not had any vision loss.         Past Medical History:  Diagnosis Date   AAA (abdominal aortic aneurysm) (HCC)    Aortic atherosclerosis (HCC)    Chronic kidney disease, stage 3a (HCC)    Complete hearing loss of right ear    Diabetes mellitus without complication (HCC)    Type 2    Esophageal reflux    Hyperlipidemia    Hypertension    Hypertriglyceridemia    Migraine    PAD (peripheral artery  disease) ([place])    Current Medications: Current Meds  Medication Sig   acyclovir (ZOVIRAX) 400 MG tablet Take 400 mg by mouth 2 (two) times daily.   ALPRAZolam (XANAX) 0.5 MG tablet Take 0.5 mg by mouth at bedtime.    amLODipine (NORVASC) 10 MG tablet Take 10 mg by mouth daily.   aspirin EC 81 MG tablet Take 1 tablet (81 mg total) by mouth daily. Swallow whole.   atenolol (TENORMIN) 100 MG tablet Take 100 mg by mouth daily.     Biotin 5000 MCG CAPS Take 1 capsule by mouth daily.   colestipol (COLESTID) 1 g tablet Take 2 g by mouth 2 (two) times daily.   dicyclomine (BENTYL) 20 MG tablet Take 1-2 tablets by mouth daily.   esomeprazole (NEXIUM) 40 MG capsule Take 40 mg by mouth daily at 12 noon.   estradiol (ESTRACE) 2 MG tablet Take 2 mg by mouth daily.   indapamide (LOZOL) 2.5 MG tablet Take 2.5 mg by mouth daily.   levothyroxine (SYNTHROID, LEVOTHROID) 50 MCG tablet Take 50 mcg by mouth daily before breakfast.    metoCLOPramide (REGLAN) 10 MG tablet Take 10 mg by mouth as needed for nausea (Take one tablet every 4 to 6 hours with migraine headaches as directed for her migrains).   sertraline (ZOLOFT) 100 MG tablet Take 100 mg by mouth daily.    tiZANidine (ZANAFLEX) 4 MG tablet Take 4 mg by mouth as needed for muscle spasms.   [DISCONTINUED] fenofibrate 160 MG tablet TAKE 1 TABLET BY MOUTH  DAILY   [DISCONTINUED] icosapent Ethyl (VASCEPA) 1 g capsule TAKE 2 CAPSULES(2 GRAMS) BY MOUTH TWICE DAILY   [DISCONTINUED] ramipril (ALTACE) 10 MG capsule TAKE 1 CAPSULE(10 MG) BY MOUTH TWICE DAILY    Allergies:   Dexilant [dexlansoprazole], Crestor [rosuvastatin], Hctz [hydrochlorothiazide], Levofloxacin, Lipitor [atorvastatin], Livalo [pitavastatin], Penicillins, Pravastatin, Sulfamethoxazole-trimethoprim, Augmentin [amoxicillin-pot clavulanate], and Cipro hc [ciprofloxacin-hydrocortisone]   Social History   Tobacco Use   Smoking status: Some Days    Packs/day: 0.25    Years: 15.00    Total pack years: 3.75    Types: Cigarettes   Smokeless tobacco: Never  Vaping Use   Vaping Use: Never used  Substance Use Topics   Alcohol use: Yes    Comment: Pt reports a few glasses of wine a night   Drug use: No    Family Hx: The patient's family history includes Cancer - Other in her maternal grandmother and paternal grandmother; Cancer - Prostate in her father; Dementia in her mother; Hyperlipidemia in her father; Hypertension  in her father, mother, and paternal grandfather; Thyroid disease in her mother.  ROS   EKGs/Labs/Other Test Reviewed:    EKG:  EKG is  ordered today.  The ekg ordered today demonstrates sinus brady, HR 57, normal axis, no ST-TW changes, QTc 402 ms  Recent Labs: 07/22/2021: BUN 42; Creatinine, Ser 1.72; Potassium 4.4; Sodium 136   Recent Lipid Panel No results for input(s): "CHOL", "TRIG", "HDL", "VLDL", "LDLCALC", "LDLDIRECT" in the last 8760 hours.   Risk Assessment/Calculations/Metrics:         HYPERTENSION CONTROL Vitals:   07/08/22 1126 07/08/22 1231  BP: (!) 180/82 (!) 160/80    The patient's blood pressure is elevated above target today.  In order to address the patient's elevated BP: The blood pressure is usually elevated in clinic.  Blood pressures monitored at home have been optimal.; Blood pressure will be monitored at home to determine if medication changes need to be made.  Physical Exam:    VS:  BP (!) 160/80   Pulse (!) 57   Ht _0  (1.676 m)   Wt 134 lb (60.8 kg)   SpO2 98%   BMI 21.63 kg/m     Wt Readings from Last 3 Encounters:  07/08/22 134 lb (60.8 kg)  08/07/21 126 lb (57.2 kg)  07/22/21 129 lb (58.5 kg)    Constitutional:      Appearance: Healthy appearance. Not in distress.  Neck:     Vascular: JVD normal.  Pulmonary:     Effort: Pulmonary effort is normal.     Breath sounds: No wheezing. No rales.  Cardiovascular:     Normal rate. Regular rhythm. Normal S1. Normal S2.      Murmurs: There is no murmur.  Edema:    Peripheral edema absent.  Abdominal:     Palpations: Abdomen is soft.  Skin:    General: Skin is warm and dry.  Neurological:     Mental Status: Alert and oriented to person, place and time.          ASSESSMENT & PLAN:   Hollenhorst plaque Pt was recently noted to have cholesterol plaque in retinal artery in her L eye. F/u Carotid US with minimal bilateral carotid plaque. We discussed the need for aggressive risk  factor modification. Her recent labs showed a normal Triglyceride lever. Her LDL was 106. I think her goal should be < 70 or possibly < 55. She hesitated to start PCSK9 inhib in the past but is willing to try now. I have also recommended smoking cessation. Her BP is uncontrolled. Goal is < 130/80. She sees nephrology (Dr. Hollie Salk) who is managing her BP.   Hypertension BP uncontrolled. She notes her BP is usually elevated in clinic and 140/80s at home. I have asked her to reduce salt intake and work on quitting smoking I have asked her to monitor her BP for the next 2 weeks and send readings for review. She also has f/u with Dr. Hollie Salk coming up soon. Continue Indapamide 2.5 mg once daily, amlodipine 10 mg once daily, ramipril 10 mg twice daily, atenolol 100 mg once daily. Consider Hydralazine if BP remains above target.   Mixed hyperlipidemia Goal LDL < 70 (possibly < 55). Labs from [facility] Saunders personally reviewed and interpreted. 02/18/22: Total Chol 166, HDL 40, LDL 106, Trigs 106, ALT 15. Continue fenofibrate 160 mg once daily, Vascepa 2 gms twice daily. Start Repatha 140 mg q 2 weeks. F/u CMET, Lipids in 8 weeks.  Type 2 diabetes mellitus without complication Labs from [facility] 02/18/22: Hgb A1c 5.5.  Tobacco abuse I have recommended cessation.             Dispo:  Return in about 6 months (around 01/07/2023) for Routine Follow Up w/ Dr. Marlou Porch.   Medication Adjustments/Labs and Tests Ordered: Current medicines are reviewed at length with the patient today.  Concerns regarding medicines are outlined above.  Tests Ordered: Orders Placed This Encounter  Procedures   Comp Met (CMET)   Lipid panel   EKG 12-Lead   Medication Changes: Meds ordered this encounter  Medications   aspirin EC 81 MG tablet    Sig: Take 1 tablet (81 mg total) by mouth daily. Swallow whole.    Dispense:  90 tablet    Refill:  3   Evolocumab (REPATHA SURECLICK) 323 MG/ML SOAJ    Sig: Inject 140 mg into the skin every 14  (fourteen) days.    Dispense:  2 mL    Refill:  11   fenofibrate 160 MG tablet    Sig: Take 1 tablet (160 mg total) by mouth daily.    Dispense:  90 tablet    Refill:  3    Requesting 1 year supply   icosapent Ethyl (VASCEPA) 1 g capsule    Sig: TAKE 2 CAPSULES(2 GRAMS) BY MOUTH TWICE DAILY    Dispense:  360 capsule    Refill:  3   ramipril (ALTACE) 10 MG capsule    Sig: TAKE 1 CAPSULE(10 MG) BY MOUTH TWICE DAILY    Dispense:  180 capsule    Refill:  3   Signed, Richardson Dopp, Saunders  07/08/2022 6:08 PM    [address], [place], [place]  [address] Phone: [phone]; Fax: [phone]

## 2022-07-08 ENCOUNTER — Encounter: Payer: Self-pay | Admitting: Physician Assistant

## 2022-07-08 ENCOUNTER — Ambulatory Visit: Payer: Medicare Other | Attending: Physician Assistant | Admitting: Physician Assistant

## 2022-07-08 VITALS — BP 160/80 | HR 57 | Ht 66.0 in | Wt 134.0 lb

## 2022-07-08 DIAGNOSIS — Z720 Tobacco use: Secondary | ICD-10-CM

## 2022-07-08 DIAGNOSIS — E782 Mixed hyperlipidemia: Secondary | ICD-10-CM

## 2022-07-08 DIAGNOSIS — I10 Essential (primary) hypertension: Secondary | ICD-10-CM

## 2022-07-08 DIAGNOSIS — H34219 Partial retinal artery occlusion, unspecified eye: Secondary | ICD-10-CM

## 2022-07-08 DIAGNOSIS — E119 Type 2 diabetes mellitus without complications: Secondary | ICD-10-CM | POA: Diagnosis not present

## 2022-07-08 MED ORDER — FENOFIBRATE 160 MG PO TABS: 160.0000 mg | ORAL_TABLET | Freq: Every day | ORAL | 3 refills | Status: DC

## 2022-07-08 MED ORDER — REPATHA SURECLICK 140 MG/ML ~~LOC~~ SOAJ: 1.0000 | SUBCUTANEOUS | 11 refills | Status: DC

## 2022-07-08 MED ORDER — RAMIPRIL 10 MG PO CAPS: ORAL_CAPSULE | ORAL | 3 refills | Status: AC

## 2022-07-08 MED ORDER — ASPIRIN 81 MG PO TBEC: 81.0000 mg | DELAYED_RELEASE_TABLET | Freq: Every day | ORAL | 3 refills | Status: DC

## 2022-07-08 MED ORDER — ICOSAPENT ETHYL 1 G PO CAPS: ORAL_CAPSULE | ORAL | 3 refills | Status: DC

## 2022-07-08 NOTE — Assessment & Plan Note (Signed)
BP uncontrolled. She notes her BP is usually elevated in clinic and 140/80s at home. I have asked her to reduce salt intake and work on quitting smoking I have asked her to monitor her BP for the next 2 weeks and send readings for review. She also has f/u with Dr. Hollie Salk coming up soon. Continue Indapamide 2.5 mg once daily, amlodipine 10 mg once daily, ramipril 10 mg twice daily, atenolol 100 mg once daily. Consider Hydralazine if BP remains above target.

## 2022-07-08 NOTE — Patient Instructions (Addendum)
Medication Instructions:  Your physician has recommended you make the following change in your medication:   START Aspirin 81 mg taking 1 daily  START the Repatha injection, 1 pen every 14 days  *If you need a refill on your cardiac medications before your next appointment, please call your pharmacy*   Lab Work: 8 WEEKS:  FASTING LIPID / CMET  If you have labs (blood work) drawn today and your tests are completely normal, you will receive your results only by: MyChart Message (if you have MyChart) OR A paper copy in the mail If you have any lab test that is abnormal or we need to change your treatment, we will call you to review the results.   Testing/Procedures: None orderd   Follow-Up: At [facility], you and your health needs are our priority.  As part of our continuing mission to provide you with exceptional heart care, we have created designated Provider Care Teams.  These Care Teams include your primary Cardiologist (physician) and Advanced Practice Providers (APPs -  Physician Assistants and Nurse Practitioners) who all work together to provide you with the care you need, when you need it.  We recommend signing up for the patient portal called "MyChart".  Sign up information is provided on this After Visit Summary.  MyChart is used to connect with patients for Virtual Visits (Telemedicine).  Patients are able to view lab/test results, encounter notes, upcoming appointments, etc.  Non-urgent messages can be sent to your provider as well.   To learn more about what you can do with MyChart, go to [email or web address].    Your next appointment:   6 month(s)  The format for your next appointment:   In Person  Provider:   Donato Schultz, MD     Other Instructions Your physician has requested that you regularly monitor and record your blood pressure readings at home. Please use the same machine at the same time of day to check your readings and record them to bring to  your follow-up visit.   Please monitor blood pressures and keep a log of your readings for 2 weeks then send them in via mchart.    Make sure to check 2 hours after your medications.    AVOID these things for 30 minutes before checking your blood pressure: No Drinking caffeine. No Drinking alcohol. No Eating. No Smoking. No Exercising.   Five minutes before checking your blood pressure: Pee. Sit in a dining chair. Avoid sitting in a soft couch or armchair. Be quiet. Do not talk    Low-Sodium Eating Plan Sodium, which is an element that makes up salt, helps you maintain a healthy balance of fluids in your body. Too much sodium can increase your blood pressure and cause fluid and waste to be held in your body. Your health care provider or dietitian may recommend following this plan if you have high blood pressure (hypertension), kidney disease, liver disease, or heart failure. Eating less sodium can help lower your blood pressure, reduce swelling, and protect your heart, liver, and kidneys. What are tips for following this plan? Reading food labels The Nutrition Facts label lists the amount of sodium in one serving of the food. If you eat more than one serving, you must multiply the listed amount of sodium by the number of servings. Choose foods with less than 140 mg of sodium per serving. Avoid foods with 300 mg of sodium or more per serving. Shopping  Look for lower-sodium products, often labeled as "  low-sodium" or "no salt added." Always check the sodium content, even if foods are labeled as "unsalted" or "no salt added." Buy fresh foods. Avoid canned foods and pre-made or frozen meals. Avoid canned, cured, or processed meats. Buy breads that have less than 80 mg of sodium per slice. Cooking  Eat more home-cooked food and less restaurant, buffet, and fast food. Avoid adding salt when cooking. Use salt-free seasonings or herbs instead of table salt or sea salt. Check with your  health care provider or pharmacist before using salt substitutes. Cook with plant-based oils, such as canola, sunflower, or olive oil. Meal planning When eating at a restaurant, ask that your food be prepared with less salt or no salt, if possible. Avoid dishes labeled as brined, pickled, cured, smoked, or made with soy sauce, miso, or teriyaki sauce. Avoid foods that contain MSG (monosodium glutamate). MSG is sometimes added to [place] food, bouillon, and some canned foods. Make meals that can be grilled, baked, poached, roasted, or steamed. These are generally made with less sodium. General information Most people on this plan should limit their sodium intake to 1,500-2,000 mg (milligrams) of sodium each day. What foods should I eat? Fruits Fresh, frozen, or canned fruit. Fruit juice. Vegetables Fresh or frozen vegetables. "No salt added" canned vegetables. "No salt added" tomato sauce and paste. Low-sodium or reduced-sodium tomato and vegetable juice. Grains Low-sodium cereals, including oats, puffed wheat and rice, and shredded wheat. Low-sodium crackers. Unsalted rice. Unsalted pasta. Low-sodium bread. Whole-grain breads and whole-grain pasta. Meats and other proteins Fresh or frozen (no salt added) meat, poultry, seafood, and fish. Low-sodium canned tuna and salmon. Unsalted nuts. Dried peas, beans, and lentils without added salt. Unsalted canned beans. Eggs. Unsalted nut butters. Dairy Milk. Soy milk. Cheese that is naturally low in sodium, such as ricotta cheese, fresh mozzarella, or Swiss cheese. Low-sodium or reduced-sodium cheese. Cream cheese. Yogurt. Seasonings and condiments Fresh and dried herbs and spices. Salt-free seasonings. Low-sodium mustard and ketchup. Sodium-free salad dressing. Sodium-free light mayonnaise. Fresh or refrigerated horseradish. Lemon juice. Vinegar. Other foods Homemade, reduced-sodium, or low-sodium soups. Unsalted popcorn and pretzels. Low-salt or  salt-free chips. The items listed above may not be a complete list of foods and beverages you can eat. Contact a dietitian for more information. What foods should I avoid? Vegetables Sauerkraut, pickled vegetables, and relishes. Olives. [place] fries. Onion rings. Regular canned vegetables (not low-sodium or reduced-sodium). Regular canned tomato sauce and paste (not low-sodium or reduced-sodium). Regular tomato and vegetable juice (not low-sodium or reduced-sodium). Frozen vegetables in sauces. Grains Instant hot cereals. Bread stuffing, pancake, and biscuit mixes. Croutons. Seasoned rice or pasta mixes. Noodle soup cups. Boxed or frozen macaroni and cheese. Regular salted crackers. Self-rising flour. Meats and other proteins Meat or fish that is salted, canned, smoked, spiced, or pickled. Precooked or cured meat, such as sausages or meat loaves. Berniece Salines. Ham. Pepperoni. Hot dogs. Corned beef. Chipped beef. Salt pork. Jerky. Pickled herring. Anchovies and sardines. Regular canned tuna. Salted nuts. Dairy Processed cheese and cheese spreads. Hard cheeses. Cheese curds. Blue cheese. Feta cheese. String cheese. Regular cottage cheese. Buttermilk. Canned milk. Fats and oils Salted butter. Regular margarine. Ghee. Bacon fat. Seasonings and condiments Onion salt, garlic salt, seasoned salt, table salt, and sea salt. Canned and packaged gravies. Worcestershire sauce. Tartar sauce. Barbecue sauce. Teriyaki sauce. Soy sauce, including reduced-sodium. Steak sauce. Fish sauce. Oyster sauce. Cocktail sauce. Horseradish that you find on the shelf. Regular ketchup and mustard. Meat flavorings and tenderizers.  Bouillon cubes. Hot sauce. Pre-made or packaged marinades. Pre-made or packaged taco seasonings. Relishes. Regular salad dressings. Salsa. Other foods Salted popcorn and pretzels. Corn chips and puffs. Potato and tortilla chips. Canned or dried soups. Pizza. Frozen entrees and pot pies. The items listed above  may not be a complete list of foods and beverages you should avoid. Contact a dietitian for more information. Summary Eating less sodium can help lower your blood pressure, reduce swelling, and protect your heart, liver, and kidneys. Most people on this plan should limit their sodium intake to 1,500-2,000 mg (milligrams) of sodium each day. Canned, boxed, and frozen foods are high in sodium. Restaurant foods, fast foods, and pizza are also very high in sodium. You also get sodium by adding salt to food. Try to cook at home, eat more fresh fruits and vegetables, and eat less fast food and canned, processed, or prepared foods. This information is not intended to replace advice given to you by your health care provider. Make sure you discuss any questions you have with your health care provider. Document Revised: 10/14/2019 Document Reviewed: 08/10/2019 Elsevier Patient Education  2023 Elsevier Inc.  Important Information About Sugar

## 2022-07-08 NOTE — Assessment & Plan Note (Signed)
Pt was recently noted to have cholesterol plaque in retinal artery in her L eye. F/u Carotid US with minimal bilateral carotid plaque. We discussed the need for aggressive risk factor modification. Her recent labs showed a normal Triglyceride lever. Her LDL was 106. I think her goal should be < 70 or possibly < 55. She hesitated to start PCSK9 inhib in the past but is willing to try now. I have also recommended smoking cessation. Her BP is uncontrolled. Goal is < 130/80. She sees nephrology (Dr. Hollie Salk) who is managing her BP.

## 2022-07-08 NOTE — Assessment & Plan Note (Signed)
I have recommended cessation. 

## 2022-07-08 NOTE — Assessment & Plan Note (Addendum)
Goal LDL < 70 (possibly < 55). Labs from [facility] personally reviewed and interpreted. 02/18/22: Total Chol 166, HDL 40, LDL 106, Trigs 106, ALT 15. Continue fenofibrate 160 mg once daily, Vascepa 2 gms twice daily. Start Repatha 140 mg q 2 weeks. F/u CMET, Lipids in 8 weeks.

## 2022-07-08 NOTE — Assessment & Plan Note (Signed)
Labs from [facility] 02/18/22: Hgb A1c 5.5.

## 2022-07-25 ENCOUNTER — Other Ambulatory Visit: Payer: Self-pay | Admitting: Cardiology

## 2022-08-11 ENCOUNTER — Other Ambulatory Visit (HOSPITAL_COMMUNITY): Payer: Self-pay | Admitting: Internal Medicine

## 2022-08-11 DIAGNOSIS — I6523 Occlusion and stenosis of bilateral carotid arteries: Secondary | ICD-10-CM

## 2022-09-02 ENCOUNTER — Ambulatory Visit: Payer: Medicare Other | Attending: Physician Assistant

## 2022-09-02 DIAGNOSIS — I10 Essential (primary) hypertension: Secondary | ICD-10-CM

## 2022-09-02 DIAGNOSIS — E782 Mixed hyperlipidemia: Secondary | ICD-10-CM

## 2022-09-02 DIAGNOSIS — H34219 Partial retinal artery occlusion, unspecified eye: Secondary | ICD-10-CM

## 2022-09-03 LAB — COMPREHENSIVE METABOLIC PANEL
ALT: 23 IU/L (ref 0–32)
AST: 35 IU/L (ref 0–40)
Albumin/Globulin Ratio: 1.7 (ref 1.2–2.2)
Albumin: 4.6 g/dL (ref 3.9–4.9)
Alkaline Phosphatase: 25 IU/L — ABNORMAL LOW (ref 44–121)
BUN/Creatinine Ratio: 20 (ref 12–28)
BUN: 24 mg/dL (ref 8–27)
Bilirubin Total: 0.4 mg/dL (ref 0.0–1.2)
CO2: 20 mmol/L (ref 20–29)
Calcium: 9.6 mg/dL (ref 8.7–10.3)
Chloride: 103 mmol/L (ref 96–106)
Creatinine, Ser: 1.22 mg/dL — ABNORMAL HIGH (ref 0.57–1.00)
Globulin, Total: 2.7 g/dL (ref 1.5–4.5)
Glucose: 69 mg/dL — ABNORMAL LOW (ref 70–99)
Potassium: 4 mmol/L (ref 3.5–5.2)
Sodium: 138 mmol/L (ref 134–144)
Total Protein: 7.3 g/dL (ref 6.0–8.5)
eGFR: 49 mL/min/{1.73_m2} — ABNORMAL LOW (ref >59)

## 2022-09-03 LAB — LIPID PANEL
Chol/HDL Ratio: 1.7 ratio (ref 0.0–4.4)
Cholesterol, Total: 67 mg/dL — ABNORMAL LOW (ref 100–199)
HDL: 39 mg/dL — ABNORMAL LOW (ref >39)
LDL Chol Calc (NIH): 10 mg/dL (ref 0–99)
Triglycerides: 86 mg/dL (ref 0–149)
VLDL Cholesterol Cal: 18 mg/dL (ref 5–40)

## 2022-09-18 NOTE — Progress Notes (Signed)
Pt has been made aware of normal result and verbalized understanding.  jw

## 2022-10-01 ENCOUNTER — Other Ambulatory Visit (HOSPITAL_COMMUNITY): Payer: Self-pay

## 2022-12-09 ENCOUNTER — Ambulatory Visit (INDEPENDENT_AMBULATORY_CARE_PROVIDER_SITE_OTHER): Payer: Medicare Other | Admitting: Podiatry

## 2022-12-09 DIAGNOSIS — L600 Ingrowing nail: Secondary | ICD-10-CM | POA: Diagnosis not present

## 2022-12-09 NOTE — Progress Notes (Signed)
She presents today chief concern of painful hallux nails bilaterally.  She states this started hurting about a month ago.  She says there is something in medically.  Objective: Vital signs are stable she is alert and oriented x 3 pulses are palpable.  She does have nail dystrophy with thickened nails that are growing out and started digging into the tip of the toe.  There is no erythema cellulitis drainage or odor no signs of infection.  Assessment: Nail dystrophy with mild paronychia.  Plan: Debrided the nails for her today I will follow-up with her on an as-needed basis.

## 2023-04-09 DIAGNOSIS — N1831 Chronic kidney disease, stage 3a: Secondary | ICD-10-CM | POA: Diagnosis present

## 2023-04-28 ENCOUNTER — Other Ambulatory Visit: Payer: Self-pay | Admitting: Physician Assistant

## 2023-06-30 ENCOUNTER — Other Ambulatory Visit: Payer: Self-pay | Admitting: Physician Assistant

## 2023-07-03 ENCOUNTER — Ambulatory Visit (HOSPITAL_COMMUNITY)
Admission: RE | Admit: 2023-07-03 | Discharge: 2023-07-03 | Disposition: A | Discharge status: Home / Self Care | Payer: Medicare Other | Source: Ambulatory Visit | Attending: Cardiology | Admitting: Cardiology

## 2023-07-03 DIAGNOSIS — I6523 Occlusion and stenosis of bilateral carotid arteries: Secondary | ICD-10-CM | POA: Insufficient documentation

## 2023-07-21 ENCOUNTER — Other Ambulatory Visit: Payer: Self-pay | Admitting: Physician Assistant

## 2023-07-31 ENCOUNTER — Other Ambulatory Visit: Payer: Self-pay | Admitting: Physician Assistant

## 2023-08-07 ENCOUNTER — Other Ambulatory Visit: Payer: Self-pay | Admitting: *Deleted

## 2023-08-07 DIAGNOSIS — I7143 Infrarenal abdominal aortic aneurysm, without rupture: Secondary | ICD-10-CM

## 2023-08-25 ENCOUNTER — Ambulatory Visit (HOSPITAL_COMMUNITY)
Admission: RE | Admit: 2023-08-25 | Discharge: 2023-08-25 | Disposition: A | Discharge status: Home / Self Care | Payer: Medicare Other | Source: Ambulatory Visit | Attending: Vascular Surgery | Admitting: Vascular Surgery

## 2023-08-25 DIAGNOSIS — I7143 Infrarenal abdominal aortic aneurysm, without rupture: Secondary | ICD-10-CM | POA: Insufficient documentation

## 2023-08-26 ENCOUNTER — Ambulatory Visit (INDEPENDENT_AMBULATORY_CARE_PROVIDER_SITE_OTHER): Payer: Medicare Other | Admitting: Physician Assistant

## 2023-08-26 ENCOUNTER — Other Ambulatory Visit (HOSPITAL_COMMUNITY): Payer: Medicare Other

## 2023-08-26 VITALS — BP 143/78 | HR 58 | Temp 97.7°F | Wt 125.6 lb

## 2023-08-26 DIAGNOSIS — I7143 Infrarenal abdominal aortic aneurysm, without rupture: Secondary | ICD-10-CM | POA: Diagnosis not present

## 2023-08-26 DIAGNOSIS — K551 Chronic vascular disorders of intestine: Secondary | ICD-10-CM

## 2023-08-26 NOTE — Progress Notes (Signed)
Office Note     CC:  follow up Requesting Provider:  Merri Brunette, MD  HPI: Darlene Saunders is a 67 y.o. (Apr 08, 1956) female who presents for surveillance of abdominal aortic aneurysm.  She was seen in the office 2 years ago with a CTA abdomen and pelvis demonstrating a 3.2 cm abdominal aortic aneurysm.  She states her father and her brother have had AAA repairs.  She denies any new abdominal or back pain.  She also denies any claudication, rest pain, or tissue loss.  She smokes about 2 to 3 cigarettes a day.  She is on aspirin daily.  She also takes Repatha.  She has also been evaluated for mesenteric artery stenosis in the past.  She denies food fear and postprandial pain.  She does not have a great appetite and has to force herself to eat.  She has not lost significant weight over the last 2 years.   Past Medical History:  Diagnosis Date   AAA (abdominal aortic aneurysm) (HCC)    Aortic atherosclerosis (HCC)    Chronic kidney disease, stage 3a (HCC)    Complete hearing loss of right ear    Diabetes mellitus without complication (HCC)    Type 2    Esophageal reflux    Hyperlipidemia    Hypertension    Hypertriglyceridemia    Migraine    PAD (peripheral artery disease) (HCC)     Past Surgical History:  Procedure Laterality Date   APPENDECTOMY     hysterectomy, removal of right ovary     LAPAROTOMY     ORTHOPEDIC SURGERY      Social History   Socioeconomic History   Marital status: Married    Spouse name: Not on file   Number of children: Not on file   Years of education: Not on file   Highest education level: Not on file  Occupational History   Not on file  Tobacco Use   Smoking status: Some Days    Current packs/day: 0.25    Average packs/day: 0.3 packs/day for 15.0 years (3.8 ttl pk-yrs)    Types: Cigarettes   Smokeless tobacco: Never  Vaping Use   Vaping status: Never Used  Substance and Sexual Activity   Alcohol use: Yes    Comment: Pt reports a few  glasses of wine a night   Drug use: No   Sexual activity: Not on file  Other Topics Concern   Not on file  Social History Narrative   Not on file   Social Determinants of Health   Financial Resource Strain: Not on file  Food Insecurity: Not on file  Transportation Needs: Not on file  Physical Activity: Not on file  Stress: Not on file  Social Connections: Not on file  Intimate Partner Violence: Not on file    Family History  Problem Relation Age of Onset   Thyroid disease Mother    Hypertension Mother    Dementia Mother    Hypertension Father    Hyperlipidemia Father    Cancer - Prostate Father    Cancer - Other Maternal Grandmother    Cancer - Other Paternal Grandmother    Hypertension Paternal Grandfather     Current Outpatient Medications  Medication Sig Dispense Refill   acyclovir (ZOVIRAX) 400 MG tablet Take 400 mg by mouth 2 (two) times daily.     ALPRAZolam (XANAX) 0.5 MG tablet Take 0.5 mg by mouth at bedtime.      amLODipine (NORVASC) 10 MG tablet  Take 10 mg by mouth daily.     aspirin EC 81 MG tablet Take 1 tablet (81 mg total) by mouth daily. Swallow whole. 90 tablet 3   atenolol (TENORMIN) 100 MG tablet Take 100 mg by mouth daily.      Biotin 5000 MCG CAPS Take 1 capsule by mouth daily.     colestipol (COLESTID) 1 g tablet Take 2 g by mouth 2 (two) times daily.     dicyclomine (BENTYL) 20 MG tablet Take 1-2 tablets by mouth daily.     esomeprazole (NEXIUM) 40 MG capsule Take 40 mg by mouth daily at 12 noon.     estradiol (ESTRACE) 2 MG tablet Take 2 mg by mouth daily.     fenofibrate 160 MG tablet TAKE 1 TABLET BY MOUTH DAILY 90 tablet 0   icosapent Ethyl (VASCEPA) 1 g capsule Take 2 capsules (2 g total) by mouth 2 (two) times daily. Please call [phone] to schedule an appointment for future refills. Thank you. 1st attempt. 120 capsule 0   indapamide (LOZOL) 2.5 MG tablet Take 2.5 mg by mouth daily.     levothyroxine (SYNTHROID, LEVOTHROID) 50 MCG tablet  Take 50 mcg by mouth daily before breakfast.      metoCLOPramide (REGLAN) 10 MG tablet Take 10 mg by mouth as needed for nausea (Take one tablet every 4 to 6 hours with migraine headaches as directed for her migrains).     ramipril (ALTACE) 10 MG capsule TAKE 1 CAPSULE(10 MG) BY MOUTH TWICE DAILY 180 capsule 3   REPATHA SURECLICK 140 MG/ML SOAJ INJECT UNDER THE SKIN EVERY 14 DAYS 2 mL 11   sertraline (ZOLOFT) 100 MG tablet Take 100 mg by mouth daily.      tiZANidine (ZANAFLEX) 4 MG tablet Take 4 mg by mouth as needed for muscle spasms.     No current facility-administered medications for this visit.    Allergies  Allergen Reactions   Dexilant [Dexlansoprazole] Diarrhea   Crestor [Rosuvastatin]     Muscle aches on 5mg  3x/week   Hctz [Hydrochlorothiazide]     Rash   Levofloxacin     Other reaction(s): myalgias   Lipitor [Atorvastatin]     Muscle aches   Livalo [Pitavastatin]     Myalgias on 2-4mg  daily   Penicillins    Pravastatin     Muscle aches on 40mg  daily   Sulfamethoxazole-Trimethoprim Hives   Augmentin [Amoxicillin-Pot Clavulanate] Hives   Cipro Hc [Ciprofloxacin-Hydrocortisone]     Topical reaction     REVIEW OF SYSTEMS:   [X]  denotes positive finding, [ ]  denotes negative finding Cardiac  Comments:  Chest pain or chest pressure:    Shortness of breath upon exertion:    Short of breath when lying flat:    Irregular heart rhythm:        Vascular    Pain in calf, thigh, or hip brought on by ambulation:    Pain in feet at night that wakes you up from your sleep:     Blood clot in your veins:    Leg swelling:         Pulmonary    Oxygen at home:    Productive cough:     Wheezing:         Neurologic    Sudden weakness in arms or legs:     Sudden numbness in arms or legs:     Sudden onset of difficulty speaking or slurred speech:    Temporary loss of  vision in one eye:     Problems with dizziness:         Gastrointestinal    Blood in stool:      Vomited blood:         Genitourinary    Burning when urinating:     Blood in urine:        Psychiatric    Major depression:         Hematologic    Bleeding problems:    Problems with blood clotting too easily:        Skin    Rashes or ulcers:        Constitutional    Fever or chills:      PHYSICAL EXAMINATION:  Vitals:   08/26/23 0946  BP: (!) 143/78  Pulse: (!) 58  Temp: 97.7 F (36.5 C)  TempSrc: Temporal  SpO2: 98%  Weight: 125 lb 9.6 oz (57 kg)    General:  WDWN in NAD; vital signs documented above Gait: Not observed HENT: WNL, normocephalic Pulmonary: normal non-labored breathing , without Rales, rhonchi,  wheezing Cardiac: regular HR Abdomen: soft, NT, no masses Skin: without rashes Vascular Exam/Pulses: Palpable right DP; absent left pedal pulses Extremities: without ischemic changes, without Gangrene , without cellulitis; without open wounds;  Musculoskeletal: no muscle wasting or atrophy  Neurologic: A&O X 3 Psychiatric:  The pt has Normal affect.   Non-Invasive Vascular Imaging:   3.2 cm AAA at largest diameter    ASSESSMENT/PLAN:: 67 y.o. female here for follow up for surveillance of AAA and mesenteric stenosis  AAA is unchanged over the past 2 years measuring 3.2 cm by duplex.  We will repeat AAA duplex in 2 years.  We will also add a mesenteric duplex at the same time.  She is without postprandial pain and food fear.  She will continue her aspirin daily.  She knows to call/return office sooner with any questions or concerns.   Emilie Rutter, PA-C [facility] [phone]  Clinic MD:   Randie Heinz

## 2023-09-03 ENCOUNTER — Other Ambulatory Visit: Payer: Self-pay | Admitting: Physician Assistant

## 2023-09-06 ENCOUNTER — Other Ambulatory Visit: Payer: Self-pay | Admitting: Physician Assistant

## 2023-10-07 ENCOUNTER — Other Ambulatory Visit: Payer: Self-pay | Admitting: Physician Assistant

## 2023-10-29 ENCOUNTER — Other Ambulatory Visit: Payer: Self-pay

## 2023-10-29 MED ORDER — FENOFIBRATE 160 MG PO TABS: 160.0000 mg | ORAL_TABLET | Freq: Every day | ORAL | 0 refills | Status: DC

## 2023-11-06 ENCOUNTER — Ambulatory Visit: Payer: Medicare Other | Admitting: Physician Assistant

## 2023-11-12 ENCOUNTER — Other Ambulatory Visit: Payer: Self-pay

## 2023-11-12 MED ORDER — ICOSAPENT ETHYL 1 G PO CAPS: 2.0000 g | ORAL_CAPSULE | Freq: Two times a day (BID) | ORAL | 0 refills | Status: DC

## 2023-11-26 ENCOUNTER — Encounter (HOSPITAL_COMMUNITY): Payer: Self-pay | Admitting: Emergency Medicine

## 2023-11-26 ENCOUNTER — Other Ambulatory Visit: Payer: Self-pay | Admitting: Physician Assistant

## 2023-11-26 ENCOUNTER — Other Ambulatory Visit: Payer: Self-pay

## 2023-11-26 ENCOUNTER — Inpatient Hospital Stay (HOSPITAL_COMMUNITY)
Admission: EM | Admit: 2023-11-26 | Discharge: 2023-11-28 | DRG: 641 | Disposition: A | Discharge status: Home / Self Care | Attending: Family Medicine | Admitting: Family Medicine

## 2023-11-26 DIAGNOSIS — Z7982 Long term (current) use of aspirin: Secondary | ICD-10-CM

## 2023-11-26 DIAGNOSIS — F17210 Nicotine dependence, cigarettes, uncomplicated: Secondary | ICD-10-CM | POA: Diagnosis present

## 2023-11-26 DIAGNOSIS — Z8679 Personal history of other diseases of the circulatory system: Secondary | ICD-10-CM

## 2023-11-26 DIAGNOSIS — Z881 Allergy status to other antibiotic agents status: Secondary | ICD-10-CM

## 2023-11-26 DIAGNOSIS — Z83438 Family history of other disorder of lipoprotein metabolism and other lipidemia: Secondary | ICD-10-CM

## 2023-11-26 DIAGNOSIS — E878 Other disorders of electrolyte and fluid balance, not elsewhere classified: Secondary | ICD-10-CM | POA: Diagnosis not present

## 2023-11-26 DIAGNOSIS — N179 Acute kidney failure, unspecified: Secondary | ICD-10-CM | POA: Diagnosis present

## 2023-11-26 DIAGNOSIS — Z8701 Personal history of pneumonia (recurrent): Secondary | ICD-10-CM | POA: Diagnosis not present

## 2023-11-26 DIAGNOSIS — Z90710 Acquired absence of both cervix and uterus: Secondary | ICD-10-CM | POA: Diagnosis not present

## 2023-11-26 DIAGNOSIS — E1122 Type 2 diabetes mellitus with diabetic chronic kidney disease: Secondary | ICD-10-CM | POA: Diagnosis present

## 2023-11-26 DIAGNOSIS — F419 Anxiety disorder, unspecified: Secondary | ICD-10-CM | POA: Diagnosis present

## 2023-11-26 DIAGNOSIS — Z79890 Hormone replacement therapy (postmenopausal): Secondary | ICD-10-CM | POA: Diagnosis not present

## 2023-11-26 DIAGNOSIS — I10 Essential (primary) hypertension: Secondary | ICD-10-CM | POA: Diagnosis present

## 2023-11-26 DIAGNOSIS — N1831 Chronic kidney disease, stage 3a: Secondary | ICD-10-CM | POA: Diagnosis present

## 2023-11-26 DIAGNOSIS — Z8349 Family history of other endocrine, nutritional and metabolic diseases: Secondary | ICD-10-CM | POA: Diagnosis not present

## 2023-11-26 DIAGNOSIS — K219 Gastro-esophageal reflux disease without esophagitis: Secondary | ICD-10-CM | POA: Diagnosis present

## 2023-11-26 DIAGNOSIS — R112 Nausea with vomiting, unspecified: Secondary | ICD-10-CM | POA: Diagnosis present

## 2023-11-26 DIAGNOSIS — Z880 Allergy status to penicillin: Secondary | ICD-10-CM

## 2023-11-26 DIAGNOSIS — E039 Hypothyroidism, unspecified: Secondary | ICD-10-CM | POA: Diagnosis present

## 2023-11-26 DIAGNOSIS — Z882 Allergy status to sulfonamides status: Secondary | ICD-10-CM

## 2023-11-26 DIAGNOSIS — I129 Hypertensive chronic kidney disease with stage 1 through stage 4 chronic kidney disease, or unspecified chronic kidney disease: Secondary | ICD-10-CM | POA: Diagnosis present

## 2023-11-26 DIAGNOSIS — E1151 Type 2 diabetes mellitus with diabetic peripheral angiopathy without gangrene: Secondary | ICD-10-CM | POA: Diagnosis present

## 2023-11-26 DIAGNOSIS — Z8249 Family history of ischemic heart disease and other diseases of the circulatory system: Secondary | ICD-10-CM | POA: Diagnosis not present

## 2023-11-26 DIAGNOSIS — Z888 Allergy status to other drugs, medicaments and biological substances status: Secondary | ICD-10-CM

## 2023-11-26 DIAGNOSIS — E782 Mixed hyperlipidemia: Secondary | ICD-10-CM | POA: Diagnosis present

## 2023-11-26 DIAGNOSIS — E860 Dehydration: Secondary | ICD-10-CM | POA: Diagnosis present

## 2023-11-26 DIAGNOSIS — E876 Hypokalemia: Secondary | ICD-10-CM | POA: Diagnosis present

## 2023-11-26 DIAGNOSIS — E872 Acidosis, unspecified: Secondary | ICD-10-CM | POA: Diagnosis present

## 2023-11-26 DIAGNOSIS — Z79899 Other long term (current) drug therapy: Secondary | ICD-10-CM

## 2023-11-26 DIAGNOSIS — E119 Type 2 diabetes mellitus without complications: Secondary | ICD-10-CM

## 2023-11-26 DIAGNOSIS — H9191 Unspecified hearing loss, right ear: Secondary | ICD-10-CM | POA: Diagnosis present

## 2023-11-26 LAB — CBC WITH DIFFERENTIAL/PLATELET
Abs Immature Granulocytes: 0.04 10*3/uL (ref 0.00–0.07)
Basophils Absolute: 0.1 10*3/uL (ref 0.0–0.1)
Basophils Relative: 1 %
Eosinophils Absolute: 0.1 10*3/uL (ref 0.0–0.5)
Eosinophils Relative: 1 %
HCT: 37.3 % (ref 36.0–46.0)
Hemoglobin: 12 g/dL (ref 12.0–15.0)
Immature Granulocytes: 0 %
Lymphocytes Relative: 42 %
Lymphs Abs: 4.3 10*3/uL — ABNORMAL HIGH (ref 0.7–4.0)
MCH: 35.3 pg — ABNORMAL HIGH (ref 26.0–34.0)
MCHC: 32.2 g/dL (ref 30.0–36.0)
MCV: 109.7 fL — ABNORMAL HIGH (ref 80.0–100.0)
Monocytes Absolute: 0.7 10*3/uL (ref 0.1–1.0)
Monocytes Relative: 7 %
Neutro Abs: 5 10*3/uL (ref 1.7–7.7)
Neutrophils Relative %: 49 %
Platelets: 216 10*3/uL (ref 150–400)
RBC: 3.4 MIL/uL — ABNORMAL LOW (ref 3.87–5.11)
RDW: 12.7 % (ref 11.5–15.5)
WBC: 10.2 10*3/uL (ref 4.0–10.5)
nRBC: 0 % (ref 0.0–0.2)

## 2023-11-26 LAB — COMPREHENSIVE METABOLIC PANEL
ALT: 24 U/L (ref 0–44)
AST: 37 U/L (ref 15–41)
Albumin: 4 g/dL (ref 3.5–5.0)
Alkaline Phosphatase: 29 U/L — ABNORMAL LOW (ref 38–126)
Anion gap: 13 (ref 5–15)
BUN: 41 mg/dL — ABNORMAL HIGH (ref 8–23)
CO2: 16 mmol/L — ABNORMAL LOW (ref 22–32)
Calcium: 6.6 mg/dL — ABNORMAL LOW (ref 8.9–10.3)
Chloride: 105 mmol/L (ref 98–111)
Creatinine, Ser: 1.69 mg/dL — ABNORMAL HIGH (ref 0.44–1.00)
GFR, Estimated: 33 mL/min — ABNORMAL LOW (ref >60)
Glucose, Bld: 81 mg/dL (ref 70–99)
Potassium: 3 mmol/L — ABNORMAL LOW (ref 3.5–5.1)
Sodium: 134 mmol/L — ABNORMAL LOW (ref 135–145)
Total Bilirubin: 0.8 mg/dL (ref 0.0–1.2)
Total Protein: 7.3 g/dL (ref 6.5–8.1)

## 2023-11-26 LAB — URINALYSIS, W/ REFLEX TO CULTURE (INFECTION SUSPECTED)
Bilirubin Urine: NEGATIVE
Glucose, UA: NEGATIVE mg/dL
Hgb urine dipstick: NEGATIVE
Ketones, ur: NEGATIVE mg/dL
Leukocytes,Ua: NEGATIVE
Nitrite: NEGATIVE
Protein, ur: NEGATIVE mg/dL
Specific Gravity, Urine: 1.011 (ref 1.005–1.030)
pH: 5 (ref 5.0–8.0)

## 2023-11-26 LAB — PHOSPHORUS: Phosphorus: 4.4 mg/dL (ref 2.5–4.6)

## 2023-11-26 LAB — LIPASE, BLOOD: Lipase: 59 U/L — ABNORMAL HIGH (ref 11–51)

## 2023-11-26 LAB — BLOOD GAS, VENOUS
Acid-base deficit: 6.3 mmol/L — ABNORMAL HIGH (ref 0.0–2.0)
Bicarbonate: 19 mmol/L — ABNORMAL LOW (ref 20.0–28.0)
Drawn by: 55938
O2 Saturation: 56.6 %
Patient temperature: 37
pCO2, Ven: 36 mmHg — ABNORMAL LOW (ref 44–60)
pH, Ven: 7.33 (ref 7.25–7.43)
pO2, Ven: 34 mmHg (ref 32–45)

## 2023-11-26 LAB — MAGNESIUM: Magnesium: 0.8 mg/dL — CL (ref 1.7–2.4)

## 2023-11-26 MED ORDER — ONDANSETRON HCL 4 MG/2ML IJ SOLN
4.0000 mg | Freq: Once | INTRAMUSCULAR | Status: AC
  Administered 2023-11-26: 4 mg via INTRAVENOUS
  Filled 2023-11-26: qty 2

## 2023-11-26 MED ORDER — POTASSIUM CHLORIDE CRYS ER 20 MEQ PO TBCR
40.0000 meq | EXTENDED_RELEASE_TABLET | Freq: Once | ORAL | Status: AC
  Administered 2023-11-26: 40 meq via ORAL
  Filled 2023-11-26: qty 4

## 2023-11-26 MED ORDER — SODIUM CHLORIDE 0.9 % IV BOLUS
1000.0000 mL | Freq: Once | INTRAVENOUS | Status: AC
  Administered 2023-11-26: 1000 mL via INTRAVENOUS

## 2023-11-26 MED ORDER — MAGNESIUM SULFATE 2 GM/50ML IV SOLN
2.0000 g | Freq: Once | INTRAVENOUS | Status: AC
  Administered 2023-11-26: 2 g via INTRAVENOUS
  Filled 2023-11-26: qty 50

## 2023-11-26 NOTE — ED Notes (Addendum)
 Pt states she cannot provide urine sample at this time, pt states she will notify staff when she's ready. Will monitor.

## 2023-11-26 NOTE — H&P (Signed)
 History and Physical    Patient: Darlene Saunders GNF:[id number] DOB: 17-Aug-1956 DOA: 11/26/2023 DOS: the patient was seen and examined on 11/26/2023 PCP: Merri Brunette, MD  Patient coming from: Home  Chief Complaint:  Chief Complaint  Patient presents with   Nausea   HPI: Darlene Saunders is a 68 y.o. female with medical history significant of essential hypertension, type 2 diabetes, hyperlipidemia, hypothyroidism, chronic kidney disease stage III,Remote tobacco abuse, abdominal arctic aneurysm and peripheral vascular disease, GERD among other things who presented to the ER with persistent nausea.  Also vomiting for about 5 days.  Patient apparently had acute viral illness that appears like flu last month around the middle of February.  Following that she had pneumonia that was treated with antibiotics.  Also steroids.  Symptoms have resolved over 5 days ago but continues to have nausea.  Also has occasional vomiting.  Came to the ER today and was found to have significant electrolyte abnormalities after going initially to urgent care center.  Patient is symptomatic with some mild symptoms of tetany and was found to be hypocalcemic.  Also hypomagnesemic and hypokalemic.  She is being admitted for management of her electrolyte abnormalities.  Review of Systems: As mentioned in the history of present illness. All other systems reviewed and are negative. Past Medical History:  Diagnosis Date   AAA (abdominal aortic aneurysm) (HCC)    Aortic atherosclerosis (HCC)    Chronic kidney disease, stage 3a (HCC)    Complete hearing loss of right ear    Diabetes mellitus without complication (HCC)    Type 2    Esophageal reflux    Hyperlipidemia    Hypertension    Hypertriglyceridemia    Migraine    PAD (peripheral artery disease) (HCC)    Past Surgical History:  Procedure Laterality Date   APPENDECTOMY     hysterectomy, removal of right ovary     LAPAROTOMY     ORTHOPEDIC SURGERY     Social  History:  reports that she has been smoking cigarettes. She has a 3.8 pack-year smoking history. She has never used smokeless tobacco. She reports current alcohol use. She reports that she does not use drugs.  Allergies  Allergen Reactions   Dexilant [Dexlansoprazole] Diarrhea   Crestor [Rosuvastatin]     Muscle aches on 5mg  3x/week   Hctz [Hydrochlorothiazide]     Rash   Levofloxacin     Other reaction(s): myalgias   Lipitor [Atorvastatin]     Muscle aches   Livalo [Pitavastatin]     Myalgias on 2-4mg  daily   Penicillins    Pravastatin     Muscle aches on 40mg  daily   Sulfamethoxazole-Trimethoprim Hives   Augmentin [Amoxicillin-Pot Clavulanate] Hives   Cipro Hc [Ciprofloxacin-Hydrocortisone]     Topical reaction    Family History  Problem Relation Age of Onset   Thyroid disease Mother    Hypertension Mother    Dementia Mother    Hypertension Father    Hyperlipidemia Father    Cancer - Prostate Father    Cancer - Other Maternal Grandmother    Cancer - Other Paternal Grandmother    Hypertension Paternal Grandfather     Prior to Admission medications   Medication Sig Start Date End Date Taking? Authorizing Provider  acyclovir (ZOVIRAX) 400 MG tablet Take 400 mg by mouth 2 (two) times daily. 09/20/19   [provider]  ALPRAZolam Prudy Feeler) 0.5 MG tablet Take 0.5 mg by mouth at bedtime.  06/09/15  [provider]  amLODipine (NORVASC) 10 MG tablet Take 10 mg by mouth daily.    [provider]  aspirin EC 81 MG tablet Take 1 tablet (81 mg total) by mouth daily. Swallow whole. 07/08/22   Tereso Newcomer T, PA-C  atenolol (TENORMIN) 100 MG tablet Take 100 mg by mouth daily.  12/11/13   [provider]  Biotin 5000 MCG CAPS Take 1 capsule by mouth daily.    [provider]  colestipol (COLESTID) 1 g tablet Take 2 g by mouth 2 (two) times daily. 06/15/21   [provider]  dicyclomine (BENTYL) 20 MG tablet Take 1-2 tablets by mouth  daily. 04/14/22   [provider]  esomeprazole (NEXIUM) 40 MG capsule Take 40 mg by mouth daily at 12 noon.    [provider]  estradiol (ESTRACE) 2 MG tablet Take 2 mg by mouth daily. 12/28/19   [provider]  fenofibrate 160 MG tablet TAKE 1 TABLET BY MOUTH DAILY 11/26/23   Tereso Newcomer T, PA-C  icosapent Ethyl (VASCEPA) 1 g capsule Take 2 capsules (2 g total) by mouth 2 (two) times daily. 11/12/23   Jake Bathe, MD  indapamide (LOZOL) 2.5 MG tablet Take 2.5 mg by mouth daily.    [provider]  levothyroxine (SYNTHROID, LEVOTHROID) 50 MCG tablet Take 50 mcg by mouth daily before breakfast.  12/11/13   [provider]  metoCLOPramide (REGLAN) 10 MG tablet Take 10 mg by mouth as needed for nausea (Take one tablet every 4 to 6 hours with migraine headaches as directed for her migrains).    [provider]  ramipril (ALTACE) 10 MG capsule TAKE 1 CAPSULE(10 MG) BY MOUTH TWICE DAILY 07/08/22   Tereso Newcomer T, PA-C  REPATHA SURECLICK 140 MG/ML SOAJ INJECT UNDER THE SKIN EVERY 14 DAYS 06/30/23   Tereso Newcomer T, PA-C  sertraline (ZOLOFT) 100 MG tablet Take 100 mg by mouth daily.  12/11/13   [provider]  tiZANidine (ZANAFLEX) 4 MG tablet Take 4 mg by mouth as needed for muscle spasms. 06/12/21   [provider]    Physical Exam: Vitals:   11/26/23 1950 11/26/23 2202  BP: (!) 150/79 111/70  Pulse: (!) 57 (!) 57  Resp: 18 12  Temp: 97.9 F (36.6 C) 97.8 F (36.6 C)  TempSrc: Oral Oral  SpO2: 100% 100%   Constitutional: Acutely ill looking, weak, NAD, calm, comfortable Eyes: PERRL, lids and conjunctivae normal ENMT: Mucous membranes are dry. Posterior pharynx clear of any exudate or lesions.Normal dentition.  Neck: normal, supple, no masses, no thyromegaly Respiratory: clear to auscultation bilaterally, no wheezing, no crackles. Normal respiratory effort. No accessory muscle use.  Cardiovascular: Regular rate and  rhythm, no murmurs / rubs / gallops. No extremity edema. 2+ pedal pulses. No carotid bruits.  Abdomen: no tenderness, no masses palpated. No hepatosplenomegaly. Bowel sounds positive.  Musculoskeletal: Good range of motion, no joint swelling or tenderness, Skin: no rashes, lesions, ulcers. No induration Neurologic: CN 2-12 grossly intact. Sensation intact, DTR normal. Strength 5/5 in all 4.  Psychiatric: Normal judgment and insight. Alert and oriented x 3. Normal mood  Data Reviewed:  Patient is afebrile With blood pressure 150/79, sodium 134 potassium 3.0, CO2 of 16 BUN is 49 creatinine is 1.69 calcium 6.6 magnesium 0.8 lipase 59.  CBC within normal.  Urinalysis essentially negative.  Assessment and Plan:  #1 severe hypomagnesemia: Together with hypokalemia and hypocalcemia.  More than likely secondary patient has  symptoms last week with ongoing nausea vomiting.  Patient will be admitted to telemetry.  We will initiate aggressive replacement with IV and oral when able.  Monitor closely.  #2 hypokalemia: Corrected together with magnesium.  #3 hypocalcemia: Corrected with albumin of 4.0 so this is real.  Will replete calcium IV.  Use calcium gluconate.  #4 diabetes: Non-insulin-dependent.  Initiate sliding scale insulin.  #5 essential hypertension: On amlodipine, Altace and atenolol.  Will resume.  #6 GERD: Continue with PPIs  #7 hypothyroidism: Will continue with levothyroxine  #8 anxiety disorder: Resume home regimen.  #9 persistent nausea with vomiting: Patient seem to be taking Reglan from home.  She is diabetic and could have a component of gastroparesis.  For now treat symptomatically.    Advance Care Planning:   Code Status: Full Code   Consults: None  Family Communication: No family at bedside  Severity of Illness: The appropriate patient status for this patient is INPATIENT. Inpatient status is judged to be reasonable and necessary in order to provide the required  intensity of service to ensure the patient's safety. The patient's presenting symptoms, physical exam findings, and initial radiographic and laboratory data in the context of their chronic comorbidities is felt to place them at high risk for further clinical deterioration. Furthermore, it is not anticipated that the patient will be medically stable for discharge from the hospital within 2 midnights of admission.   * I certify that at the point of admission it is my clinical judgment that the patient will require inpatient hospital care spanning beyond 2 midnights from the point of admission due to high intensity of service, high risk for further deterioration and high frequency of surveillance required.*  AuthorLonia Blood, MD 11/26/2023 11:58 PM  For on call review [email or web address].

## 2023-11-26 NOTE — ED Triage Notes (Signed)
 68 y/o female comer in c/o n/v/d for the past two days. PT was seen at urgent care and reports "I had labs done and they told me to come to the ER." PT comes reporting abnormal labs reporting a Ca of "6"

## 2023-11-26 NOTE — ED Provider Notes (Signed)
 [place] EMERGENCY DEPARTMENT AT [facility] Provider Note   CSN: [id number] Arrival date & time: 11/26/23  1943     History  Chief Complaint  Patient presents with   Nausea    Darlene Saunders is a 68 y.o. female history of AAA, diabetes, PAD, hypertension, GERD presented for nausea and vomiting for the past 5 days.  Patient denies fevers or abdominal pain, chest pain, shortness of breath.  Patient dates that she had a viral illness in the middle of February that turned into pneumonia and was on a cephalosporin, doxycycline, azithromycin along with prednisone and states that her symptoms have resolved to 5 days ago.  Patient still has her gallbladder.  Patient does not have her appendix.  Patient denies any dysuria.   Home Medications Prior to Admission medications   Medication Sig Start Date End Date Taking? Authorizing Provider  acyclovir (ZOVIRAX) 400 MG tablet Take 400 mg by mouth 2 (two) times daily. 09/20/19   [provider]  ALPRAZolam Prudy Feeler) 0.5 MG tablet Take 0.5 mg by mouth at bedtime.  06/09/15   [provider]  amLODipine (NORVASC) 10 MG tablet Take 10 mg by mouth daily.    [provider]  aspirin EC 81 MG tablet Take 1 tablet (81 mg total) by mouth daily. Swallow whole. 07/08/22   Tereso Newcomer T, PA-C  atenolol (TENORMIN) 100 MG tablet Take 100 mg by mouth daily.  12/11/13   [provider]  Biotin 5000 MCG CAPS Take 1 capsule by mouth daily.    [provider]  colestipol (COLESTID) 1 g tablet Take 2 g by mouth 2 (two) times daily. 06/15/21   [provider]  dicyclomine (BENTYL) 20 MG tablet Take 1-2 tablets by mouth daily. 04/14/22   [provider]  esomeprazole (NEXIUM) 40 MG capsule Take 40 mg by mouth daily at 12 noon.    [provider]  estradiol (ESTRACE) 2 MG tablet Take 2 mg by mouth daily. 12/28/19   [provider]  fenofibrate 160 MG tablet TAKE 1 TABLET BY MOUTH DAILY  11/26/23   Tereso Newcomer T, PA-C  icosapent Ethyl (VASCEPA) 1 g capsule Take 2 capsules (2 g total) by mouth 2 (two) times daily. 11/12/23   Jake Bathe, MD  indapamide (LOZOL) 2.5 MG tablet Take 2.5 mg by mouth daily.    [provider]  levothyroxine (SYNTHROID, LEVOTHROID) 50 MCG tablet Take 50 mcg by mouth daily before breakfast.  12/11/13   [provider]  metoCLOPramide (REGLAN) 10 MG tablet Take 10 mg by mouth as needed for nausea (Take one tablet every 4 to 6 hours with migraine headaches as directed for her migrains).    [provider]  ramipril (ALTACE) 10 MG capsule TAKE 1 CAPSULE(10 MG) BY MOUTH TWICE DAILY 07/08/22   Tereso Newcomer T, PA-C  REPATHA SURECLICK 140 MG/ML SOAJ INJECT UNDER THE SKIN EVERY 14 DAYS 06/30/23   Tereso Newcomer T, PA-C  sertraline (ZOLOFT) 100 MG tablet Take 100 mg by mouth daily.  12/11/13   [provider]  tiZANidine (ZANAFLEX) 4 MG tablet Take 4 mg by mouth as needed for muscle spasms. 06/12/21   [provider]      Allergies    Dexilant [dexlansoprazole], Crestor [rosuvastatin], Hctz [hydrochlorothiazide], Levofloxacin, Lipitor [atorvastatin], Livalo [pitavastatin], Penicillins, Pravastatin, Sulfamethoxazole-trimethoprim, Augmentin [amoxicillin-pot clavulanate], and Cipro hc [ciprofloxacin-hydrocortisone]    Review of Systems   Review of Systems  Physical Exam Updated Vital  Signs BP 111/70 (BP Location: Right Arm)   Pulse (!) 57   Temp 97.8 F (36.6 C) (Oral)   Resp 12   SpO2 100%  Physical Exam Vitals reviewed.  Constitutional:      General: She is not in acute distress. HENT:     Head: Normocephalic and atraumatic.  Eyes:     Extraocular Movements: Extraocular movements intact.     Conjunctiva/sclera: Conjunctivae normal.     Pupils: Pupils are equal, round, and reactive to light.  Cardiovascular:     Rate and Rhythm: Normal rate and regular rhythm.     Pulses: Normal pulses.     Heart  sounds: Normal heart sounds.     Comments: 2+ bilateral radial/dorsalis pedis pulses with regular rate Pulmonary:     Effort: Pulmonary effort is normal. No respiratory distress.     Breath sounds: Normal breath sounds.  Abdominal:     Palpations: Abdomen is soft.     Tenderness: There is no abdominal tenderness. There is no guarding or rebound.  Musculoskeletal:        General: Normal range of motion.     Cervical back: Normal range of motion and neck supple.     Comments: 5 out of 5 bilateral grip/leg extension strength  Skin:    General: Skin is warm and dry.     Capillary Refill: Capillary refill takes less than 2 seconds.  Neurological:     Mental Status: She is alert and oriented to person, place, and time.     Comments: Sensation intact in all 4 limbs Positive Trousseau sign  Psychiatric:        Mood and Affect: Mood normal.     ED Results / Procedures / Treatments   Labs (all labs ordered are listed, but only abnormal results are displayed) Labs Reviewed  COMPREHENSIVE METABOLIC PANEL - Abnormal; Notable for the following components:      Result Value   Sodium 134 (*)    Potassium 3.0 (*)    CO2 16 (*)    BUN 41 (*)    Creatinine, Ser 1.69 (*)    Calcium 6.6 (*)    Alkaline Phosphatase 29 (*)    GFR, Estimated 33 (*)    All other components within normal limits  LIPASE, BLOOD - Abnormal; Notable for the following components:   Lipase 59 (*)    All other components within normal limits  URINALYSIS, W/ REFLEX TO CULTURE (INFECTION SUSPECTED) - Abnormal; Notable for the following components:   Bacteria, UA RARE (*)    All other components within normal limits  CBC WITH DIFFERENTIAL/PLATELET - Abnormal; Notable for the following components:   RBC 3.40 (*)    MCV 109.7 (*)    MCH 35.3 (*)    Lymphs Abs 4.3 (*)    All other components within normal limits  MAGNESIUM - Abnormal; Notable for the following components:   Magnesium 0.8 (*)    All other components  within normal limits  BLOOD GAS, VENOUS - Abnormal; Notable for the following components:   pCO2, Ven 36 (*)    Bicarbonate 19.0 (*)    Acid-base deficit 6.3 (*)    All other components within normal limits  PHOSPHORUS    EKG EKG Interpretation Date/Time:  Thursday November 26 2023 19:58:22 EST Ventricular Rate:  55 PR Interval:  156 QRS Duration:  94 QT Interval:  464 QTC Calculation: 444 R Axis:   42  Text Interpretation: Sinus rhythm Probable left  atrial enlargement Abnormal R-wave progression, early transition Confirmed by Vonita Moss [phone]) on 11/26/2023 8:06:23 PM  Radiology No results found.  Procedures .Critical Care  Performed by: Netta Corrigan, PA-C Authorized by: Netta Corrigan, PA-C   Critical care provider statement:    Critical care time (minutes):  30   Critical care time was exclusive of:  Separately billable procedures and treating other patients   Critical care was necessary to treat or prevent imminent or life-threatening deterioration of the following conditions: Critical hypomagnesemia, symptomatic hypocalcemia.   Critical care was time spent personally by me on the following activities:  Blood draw for specimens, development of treatment plan with patient or surrogate, discussions with consultants, evaluation of patient's response to treatment, examination of patient, obtaining history from patient or surrogate, review of old charts, re-evaluation of patient's condition, ordering and review of radiographic studies, pulse oximetry, ordering and review of laboratory studies and ordering and performing treatments and interventions   I assumed direction of critical care for this patient from another provider in my specialty: no     Care discussed with: admitting provider       Medications Ordered in ED Medications  magnesium sulfate IVPB 2 g 50 mL (2 g Intravenous New Bag/Given 11/26/23 2313)  sodium chloride 0.9 % bolus 1,000 mL (1,000 mLs Intravenous  New Bag/Given 11/26/23 2227)  ondansetron (ZOFRAN) injection 4 mg (4 mg Intravenous Given 11/26/23 2222)  potassium chloride SA (KLOR-CON M) CR tablet 40 mEq (40 mEq Oral Given 11/26/23 2313)  ondansetron (ZOFRAN) injection 4 mg (4 mg Intravenous Given 11/26/23 2325)    ED Course/ Medical Decision Making/ A&P                                 Medical Decision Making Amount and/or Complexity of Data Reviewed Labs: ordered.  Risk Prescription drug management. Decision regarding hospitalization.   Darlene Saunders 68 y.o. presented today for abdominal pain.  Working DDx that I considered at this time includes, but not limited to, gastroenteritis, colitis, small bowel obstruction, appendicitis, cholecystitis, hepatobiliary pathology, gastritis, PUD, ACS, aortic dissection, diverticulosis/diverticulitis, pancreatitis, nephrolithiasis, medication induced, AAA, UTI, pyelonephritis, ruptured ectopic pregnancy, PID, ovarian torsion.  R/o DDx: colitis, small bowel obstruction, appendicitis, cholecystitis, hepatobiliary pathology, gastritis, PUD, ACS, aortic dissection, diverticulosis/diverticulitis, pancreatitis, nephrolithiasis, medication induced, AAA, UTI, pyelonephritis, ruptured ectopic pregnancy, PID, ovarian torsion: These are considered less likely due to history of present illness, physical exam, labs/imaging findings.  Review of prior external notes: 11/09/2023 unknown  Unique Tests and My Independent Interpretation:  CBC with differential: Unremarkable CMP: Hypokalemia 3.0, low CO2 16, creatinine 1.69 Lipase: Unremarkable UA: Unremarkable Magnesium: 0.8 Phosphorus: Unremarkable VBG: Unremarkable EKG: Rate, rhythm, axis, intervals all examined and without medically relevant abnormality. ST segments without concerns for elevations  Social Determinants of Health: none  Discussion with Independent Historian:  Husband  Discussion of Management of Tests:  Garba, MD Hospitalist  Risk: High:  hospitalization or escalation of hospital-level care  Risk Stratification Score: none  Staffed with [place], MD   Plan: On exam patient was in no acute distress with stable vitals.  On exam patient has no abdominal tenderness or peritoneal signs.  Patient states that she was concerned that she needed calcium given her low reading at the urgent care.  Patient does have positive Trousseau sign.  Patient was also hypokalemic as well at 3 and so we added on a magnesium that  was low at 0.8 and we will replenish her mag.  Phosphorus was added on which was reassuring.  Patient is no abdominal tenderness at this time do feel patient does not need CT scan of her abdomen after discussion with the patient and patient is unsure if she wants CT scan.  Will call hospitalist for admission given low electrolytes and symptomatic hypocalcemia.  I spoke to the hospitalist and patient was accepted for admission.  Patient stable to be admitted.  This chart was dictated using voice recognition software.  Despite best efforts to proofread,  errors can occur which can change the documentation meaning.         Final Clinical Impression(s) / ED Diagnoses Final diagnoses:  Hypomagnesemia  Nausea and vomiting, unspecified vomiting type    Rx / DC Orders ED Discharge Orders     None         Remi Deter 11/26/23 2359    Nira Conn, MD 11/27/23 [phone]

## 2023-11-26 NOTE — H&P (Incomplete)
 History and Physical    Patient: Darlene Saunders ZOX:[id number] DOB: 1955/10/29 DOA: 11/26/2023 DOS: the patient was seen and examined on 11/26/2023 PCP: Merri Brunette, MD  Patient coming from: {Point_of_Origin:[address]777}  Chief Complaint:  Chief Complaint  Patient presents with  . Nausea   HPI: Darlene Saunders is a 68 y.o. female with medical history significant of ***  Review of Systems: {ROS_Text:26778} Past Medical History:  Diagnosis Date  . AAA (abdominal aortic aneurysm) (HCC)   . Aortic atherosclerosis (HCC)   . Chronic kidney disease, stage 3a (HCC)   . Complete hearing loss of right ear   . Diabetes mellitus without complication (HCC)    Type 2   . Esophageal reflux   . Hyperlipidemia   . Hypertension   . Hypertriglyceridemia   . Migraine   . PAD (peripheral artery disease) (HCC)    Past Surgical History:  Procedure Laterality Date  . APPENDECTOMY    . hysterectomy, removal of right ovary    . LAPAROTOMY    . ORTHOPEDIC SURGERY     Social History:  reports that she has been smoking cigarettes. She has a 3.8 pack-year smoking history. She has never used smokeless tobacco. She reports current alcohol use. She reports that she does not use drugs.  Allergies  Allergen Reactions  . Dexilant [Dexlansoprazole] Diarrhea  . Crestor [Rosuvastatin]     Muscle aches on 5mg  3x/week  . Hctz [Hydrochlorothiazide]     Rash  . Levofloxacin     Other reaction(s): myalgias  . Lipitor [Atorvastatin]     Muscle aches  . Livalo [Pitavastatin]     Myalgias on 2-4mg  daily  . Penicillins   . Pravastatin     Muscle aches on 40mg  daily  . Sulfamethoxazole-Trimethoprim Hives  . Augmentin [Amoxicillin-Pot Clavulanate] Hives  . Cipro Hc [Ciprofloxacin-Hydrocortisone]     Topical reaction    Family History  Problem Relation Age of Onset  . Thyroid disease Mother   . Hypertension Mother   . Dementia Mother   . Hypertension Father   . Hyperlipidemia Father   . Cancer - Prostate  Father   . Cancer - Other Maternal Grandmother   . Cancer - Other Paternal Grandmother   . Hypertension Paternal Grandfather     Prior to Admission medications   Medication Sig Start Date End Date Taking? Authorizing Provider  acyclovir (ZOVIRAX) 400 MG tablet Take 400 mg by mouth 2 (two) times daily. 09/20/19   [provider]  ALPRAZolam Prudy Feeler) 0.5 MG tablet Take 0.5 mg by mouth at bedtime.  06/09/15   [provider]  amLODipine (NORVASC) 10 MG tablet Take 10 mg by mouth daily.    [provider]  aspirin EC 81 MG tablet Take 1 tablet (81 mg total) by mouth daily. Swallow whole. 07/08/22   Tereso Newcomer T, PA-C  atenolol (TENORMIN) 100 MG tablet Take 100 mg by mouth daily.  12/11/13   [provider]  Biotin 5000 MCG CAPS Take 1 capsule by mouth daily.    [provider]  colestipol (COLESTID) 1 g tablet Take 2 g by mouth 2 (two) times daily. 06/15/21   [provider]  dicyclomine (BENTYL) 20 MG tablet Take 1-2 tablets by mouth daily. 04/14/22   [provider]  esomeprazole (NEXIUM) 40 MG capsule Take 40 mg by mouth daily at 12 noon.    [provider]  estradiol (ESTRACE) 2 MG tablet Take 2 mg by mouth daily. 12/28/19  [provider]  fenofibrate 160 MG tablet TAKE 1 TABLET BY MOUTH DAILY 11/26/23   Tereso Newcomer T, PA-C  icosapent Ethyl (VASCEPA) 1 g capsule Take 2 capsules (2 g total) by mouth 2 (two) times daily. 11/12/23   Jake Bathe, MD  indapamide (LOZOL) 2.5 MG tablet Take 2.5 mg by mouth daily.    [provider]  levothyroxine (SYNTHROID, LEVOTHROID) 50 MCG tablet Take 50 mcg by mouth daily before breakfast.  12/11/13   [provider]  metoCLOPramide (REGLAN) 10 MG tablet Take 10 mg by mouth as needed for nausea (Take one tablet every 4 to 6 hours with migraine headaches as directed for her migrains).    [provider]  ramipril (ALTACE) 10 MG capsule TAKE 1 CAPSULE(10 MG)  BY MOUTH TWICE DAILY 07/08/22   Tereso Newcomer T, PA-C  REPATHA SURECLICK 140 MG/ML SOAJ INJECT UNDER THE SKIN EVERY 14 DAYS 06/30/23   Tereso Newcomer T, PA-C  sertraline (ZOLOFT) 100 MG tablet Take 100 mg by mouth daily.  12/11/13   [provider]  tiZANidine (ZANAFLEX) 4 MG tablet Take 4 mg by mouth as needed for muscle spasms. 06/12/21   [provider]    Physical Exam: Vitals:   11/26/23 1950 11/26/23 2202  BP: (!) 150/79 111/70  Pulse: (!) 57 (!) 57  Resp: 18 12  Temp: 97.9 F (36.6 C) 97.8 F (36.6 C)  TempSrc: Oral Oral  SpO2: 100% 100%   *** Data Reviewed: {Tip this will not be part of the note when signed- Document your independent interpretation of telemetry tracing, EKG, lab, Radiology test or any other diagnostic tests. Add any new diagnostic test ordered today. (Optional):26781} {Results:26384}  Assessment and Plan: No notes have been filed under this hospital service. Service: Hospitalist     Advance Care Planning:   Code Status: Full Code ***  Consults: ***  Family Communication: ***  Severity of Illness: {Observation/Inpatient:21159}  AuthorLonia Blood, MD 11/26/2023 11:58 PM  For on call review [email or web address].

## 2023-11-27 DIAGNOSIS — E878 Other disorders of electrolyte and fluid balance, not elsewhere classified: Secondary | ICD-10-CM | POA: Diagnosis not present

## 2023-11-27 LAB — COMPREHENSIVE METABOLIC PANEL
ALT: 20 U/L (ref 0–44)
ALT: 21 U/L (ref 0–44)
AST: 31 U/L (ref 15–41)
AST: 35 U/L (ref 15–41)
Albumin: 3.2 g/dL — ABNORMAL LOW (ref 3.5–5.0)
Albumin: 3.6 g/dL (ref 3.5–5.0)
Alkaline Phosphatase: 21 U/L — ABNORMAL LOW (ref 38–126)
Alkaline Phosphatase: 26 U/L — ABNORMAL LOW (ref 38–126)
Anion gap: 10 (ref 5–15)
Anion gap: 11 (ref 5–15)
BUN: 32 mg/dL — ABNORMAL HIGH (ref 8–23)
BUN: 39 mg/dL — ABNORMAL HIGH (ref 8–23)
CO2: 17 mmol/L — ABNORMAL LOW (ref 22–32)
CO2: 20 mmol/L — ABNORMAL LOW (ref 22–32)
Calcium: 6.2 mg/dL — CL (ref 8.9–10.3)
Calcium: 6.6 mg/dL — ABNORMAL LOW (ref 8.9–10.3)
Chloride: 108 mmol/L (ref 98–111)
Chloride: 108 mmol/L (ref 98–111)
Creatinine, Ser: 1.28 mg/dL — ABNORMAL HIGH (ref 0.44–1.00)
Creatinine, Ser: 1.28 mg/dL — ABNORMAL HIGH (ref 0.44–1.00)
GFR, Estimated: 46 mL/min — ABNORMAL LOW (ref >60)
GFR, Estimated: 46 mL/min — ABNORMAL LOW (ref >60)
Glucose, Bld: 109 mg/dL — ABNORMAL HIGH (ref 70–99)
Glucose, Bld: 82 mg/dL (ref 70–99)
Potassium: 3.4 mmol/L — ABNORMAL LOW (ref 3.5–5.1)
Potassium: 3.6 mmol/L (ref 3.5–5.1)
Sodium: 135 mmol/L (ref 135–145)
Sodium: 139 mmol/L (ref 135–145)
Total Bilirubin: 0.5 mg/dL (ref 0.0–1.2)
Total Bilirubin: 0.9 mg/dL (ref 0.0–1.2)
Total Protein: 6.1 g/dL — ABNORMAL LOW (ref 6.5–8.1)
Total Protein: 6.5 g/dL (ref 6.5–8.1)

## 2023-11-27 LAB — CBC
HCT: 31.3 % — ABNORMAL LOW (ref 36.0–46.0)
HCT: 31.4 % — ABNORMAL LOW (ref 36.0–46.0)
Hemoglobin: 10.2 g/dL — ABNORMAL LOW (ref 12.0–15.0)
Hemoglobin: 10.6 g/dL — ABNORMAL LOW (ref 12.0–15.0)
MCH: 35.2 pg — ABNORMAL HIGH (ref 26.0–34.0)
MCH: 35.5 pg — ABNORMAL HIGH (ref 26.0–34.0)
MCHC: 32.6 g/dL (ref 30.0–36.0)
MCHC: 33.8 g/dL (ref 30.0–36.0)
MCV: 105 fL — ABNORMAL HIGH (ref 80.0–100.0)
MCV: 107.9 fL — ABNORMAL HIGH (ref 80.0–100.0)
Platelets: 159 10*3/uL (ref 150–400)
Platelets: 174 10*3/uL (ref 150–400)
RBC: 2.9 MIL/uL — ABNORMAL LOW (ref 3.87–5.11)
RBC: 2.99 MIL/uL — ABNORMAL LOW (ref 3.87–5.11)
RDW: 12.5 % (ref 11.5–15.5)
RDW: 12.6 % (ref 11.5–15.5)
WBC: 5 10*3/uL (ref 4.0–10.5)
WBC: 6.7 10*3/uL (ref 4.0–10.5)
nRBC: 0 % (ref 0.0–0.2)
nRBC: 0 % (ref 0.0–0.2)

## 2023-11-27 LAB — GLUCOSE, CAPILLARY
Glucose-Capillary: 75 mg/dL (ref 70–99)
Glucose-Capillary: 100 mg/dL — ABNORMAL HIGH (ref 70–99)

## 2023-11-27 LAB — CBG MONITORING, ED
Glucose-Capillary: 105 mg/dL — ABNORMAL HIGH (ref 70–99)
Glucose-Capillary: 73 mg/dL (ref 70–99)
Glucose-Capillary: 95 mg/dL (ref 70–99)

## 2023-11-27 LAB — HEMOGLOBIN A1C
Hgb A1c MFr Bld: 5.3 % (ref 4.8–5.6)
Mean Plasma Glucose: 105.41 mg/dL

## 2023-11-27 LAB — HIV ANTIBODY (ROUTINE TESTING W REFLEX): HIV Screen 4th Generation wRfx: NONREACTIVE

## 2023-11-27 LAB — MAGNESIUM: Magnesium: 2 mg/dL (ref 1.7–2.4)

## 2023-11-27 MED ORDER — ENSURE ENLIVE PO LIQD: 237.0000 mL | Freq: Two times a day (BID) | ORAL | Status: DC

## 2023-11-27 MED ORDER — ENOXAPARIN SODIUM 30 MG/0.3ML IJ SOSY: 30.0000 mg | PREFILLED_SYRINGE | INTRAMUSCULAR | Status: DC

## 2023-11-27 MED ORDER — MAGNESIUM SULFATE 2 GM/50ML IV SOLN
2.0000 g | Freq: Once | INTRAVENOUS | Status: AC
  Administered 2023-11-27: 2 g via INTRAVENOUS
  Filled 2023-11-27: qty 50

## 2023-11-27 MED ORDER — ONDANSETRON HCL 4 MG PO TABS
4.0000 mg | ORAL_TABLET | Freq: Four times a day (QID) | ORAL | Status: DC | PRN
Start: 2023-11-27 — End: 2023-11-28

## 2023-11-27 MED ORDER — ALPRAZOLAM 0.5 MG PO TABS
0.5000 mg | ORAL_TABLET | Freq: Every day | ORAL | Status: DC
  Administered 2023-11-27: 0.5 mg via ORAL
  Filled 2023-11-27: qty 1

## 2023-11-27 MED ORDER — TIZANIDINE HCL 4 MG PO TABS: 4.0000 mg | ORAL_TABLET | ORAL | Status: DC | PRN

## 2023-11-27 MED ORDER — ONDANSETRON HCL 4 MG/2ML IJ SOLN
4.0000 mg | Freq: Four times a day (QID) | INTRAMUSCULAR | Status: DC | PRN
  Administered 2023-11-27 (×2): 4 mg via INTRAVENOUS
  Filled 2023-11-27 (×2): qty 2

## 2023-11-27 MED ORDER — CALCIUM GLUCONATE-NACL 2-0.675 GM/100ML-% IV SOLN
2.0000 g | Freq: Once | INTRAVENOUS | Status: AC
  Administered 2023-11-27: 2000 mg via INTRAVENOUS
  Filled 2023-11-27: qty 100

## 2023-11-27 MED ORDER — ACETAMINOPHEN 325 MG PO TABS
650.0000 mg | ORAL_TABLET | Freq: Four times a day (QID) | ORAL | Status: DC | PRN
  Administered 2023-11-27: 650 mg via ORAL
  Filled 2023-11-27: qty 2

## 2023-11-27 MED ORDER — SERTRALINE HCL 100 MG PO TABS
100.0000 mg | ORAL_TABLET | Freq: Every day | ORAL | Status: DC
  Administered 2023-11-27 – 2023-11-28 (×2): 100 mg via ORAL
  Filled 2023-11-27: qty 1
  Filled 2023-11-27: qty 2

## 2023-11-27 MED ORDER — PANTOPRAZOLE SODIUM 40 MG IV SOLR
40.0000 mg | INTRAVENOUS | Status: DC
  Administered 2023-11-27: 40 mg via INTRAVENOUS
  Filled 2023-11-27: qty 10

## 2023-11-27 MED ORDER — INSULIN ASPART 100 UNIT/ML IJ SOLN
0.0000 [IU] | Freq: Every day | INTRAMUSCULAR | Status: DC
  Filled 2023-11-27: qty 0.05

## 2023-11-27 MED ORDER — INSULIN ASPART 100 UNIT/ML IJ SOLN
0.0000 [IU] | Freq: Three times a day (TID) | INTRAMUSCULAR | Status: DC
  Filled 2023-11-27: qty 0.15

## 2023-11-27 MED ORDER — CALCIUM GLUCONATE-NACL 1-0.675 GM/50ML-% IV SOLN
1.0000 g | Freq: Once | INTRAVENOUS | Status: AC
  Administered 2023-11-27: 1000 mg via INTRAVENOUS
  Filled 2023-11-27: qty 50

## 2023-11-27 MED ORDER — ENOXAPARIN SODIUM 40 MG/0.4ML IJ SOSY
40.0000 mg | PREFILLED_SYRINGE | INTRAMUSCULAR | Status: DC
Start: 2023-11-27 — End: 2023-11-28
  Administered 2023-11-27: 40 mg via SUBCUTANEOUS
  Filled 2023-11-27 (×2): qty 0.4

## 2023-11-27 MED ORDER — POTASSIUM CHLORIDE IN NACL 40-0.9 MEQ/L-% IV SOLN
INTRAVENOUS | Status: AC
  Filled 2023-11-27 (×3): qty 1000

## 2023-11-27 NOTE — Progress Notes (Signed)
 PROGRESS NOTE    Darlene Saunders  UJW:[id number] DOB: [birth year] DOA: 11/26/2023 PCP: Merri Brunette, MD   Brief Narrative:  HPI: Darlene Saunders is a 68 y.o. female with medical history significant of essential hypertension, type 2 diabetes, hyperlipidemia, hypothyroidism, chronic kidney disease stage III,Remote tobacco abuse, abdominal arctic aneurysm and peripheral vascular disease, GERD among other things who presented to the ER with persistent nausea.  Also vomiting for about 5 days.  Patient apparently had acute viral illness that appears like flu last month around the middle of February.  Following that she had pneumonia that was treated with antibiotics.  Also steroids.  Symptoms have resolved over 5 days ago but continues to have nausea.  Also has occasional vomiting.  Came to the ER today and was found to have significant electrolyte abnormalities after going initially to urgent care center.  Patient is symptomatic with some mild symptoms of tetany and was found to be hypocalcemic.  Also hypomagnesemic and hypokalemic.  She is being admitted for management of her electrolyte abnormalities.    Assessment & Plan:   Principal Problem:   Electrolyte abnormality Active Problems:   Mixed hyperlipidemia   Type 2 diabetes mellitus without complication (HCC)   Hypertension   Anxiety   Hypothyroidism   Stage 3a chronic kidney disease (HCC)   Hypokalemia   Hypocalcemia   Hypomagnesemia  #1 severe hypomagnesemia: Together with hypokalemia and hypocalcemia.  More than likely secondary patient has symptoms last week with ongoing nausea vomiting.  Magnesium 0.8 upon arrival, replenished and normal today.   #2 hypokalemia: Corrected together with magnesium.   #3 hypocalcemia: Cleanest sample yesterday, better but is still low, will replenish further today.   #4 diabetes: Non-insulin-dependent.  Continue SSI.   #5 essential hypertension: On amlodipine, Altace and atenolol.  All of them on hold  and blood pressure normal.   #6 GERD: Start on PPI.   #7 hypothyroidism: Will continue with levothyroxine when able to take p.o.   #8 anxiety disorder: Resume home regimen.   #9 persistent nausea with vomiting: Patient seem to be taking Reglan from home.  She is diabetic and could have a component of gastroparesis as we have seen couple of cases where viral infection may cause temporary gastroparesis.  For now treat symptomatically and her symptoms are improving, she has some nausea but no vomiting since admission.  #10: AKI on CKD stage IIIa with mild metabolic acidosis: Baseline creatinine around 1.2-1.3, presented with 1.7 due to dehydration, which improved to 1.3, back to baseline.  Also came in with CO2 of 16 which improved to 20.  Continue IV fluids which will and by tonight.  DVT prophylaxis: enoxaparin (LOVENOX) injection 40 mg Start: 11/27/23 1000   Code Status: Full Code  Family Communication:  None present at bedside.  Plan of care discussed with patient in length and he/she verbalized understanding and agreed with it.  Status is: Inpatient Remains inpatient appropriate because: Patient is still symptomatic.   Estimated body mass index is 20.28 kg/m as calculated from the following:   Height as of this encounter: 5\' 6"  (1.676 m).   Weight as of this encounter: 57 kg.    Nutritional Assessment: Body mass index is 20.28 kg/m.Marland Kitchen Seen by dietician.  I agree with the assessment and plan as outlined below: Nutrition Status:        . Skin Assessment: I have examined the patient's skin and I agree with the wound assessment as performed by the wound care  RN as outlined below:    Consultants:  None  Procedures:  None  Antimicrobials:  Anti-infectives (From admission, onward)    None         Subjective: Patient seen and examined the ED, she is still symptomatic with nausea but no vomiting.  No other complaint.  Objective: Vitals:   11/27/23 0724 11/27/23  0800 11/27/23 0922 11/27/23 1000  BP: 128/66 114/65 133/71 111/63  Pulse: 60 (!) 59 63 (!) 54  Resp: 14 19 15 17   Temp: 97.6 F (36.4 C)     TempSrc: Oral     SpO2: 100% 97% 100% 98%  Weight:      Height:        Intake/Output Summary (Last 24 hours) at 11/27/2023 1116 Last data filed at 11/27/2023 0323 Gross per 24 hour  Intake 180.29 ml  Output --  Net 180.29 ml   Filed Weights   11/27/23 0000  Weight: 57 kg    Examination:  General exam: Appears calm and comfortable  Respiratory system: Clear to auscultation. Respiratory effort normal. Cardiovascular system: S1 & S2 heard, RRR. No JVD, murmurs, rubs, gallops or clicks. No pedal edema. Gastrointestinal system: Abdomen is nondistended, soft and nontender. No organomegaly or masses felt. Normal bowel sounds heard. Central nervous system: Alert and oriented. No focal neurological deficits. Extremities: Symmetric 5 x 5 power. Skin: No rashes, lesions or ulcers Psychiatry: Judgement and insight appear normal. Mood & affect appropriate.    Data Reviewed: I have personally reviewed following labs and imaging studies  CBC: Recent Labs  Lab 11/26/23 2000 11/27/23 0011 11/27/23 0500  WBC 10.2 6.7 5.0  NEUTROABS 5.0  --   --   HGB 12.0 10.6* 10.2*  HCT 37.3 31.4* 31.3*  MCV 109.7* 105.0* 107.9*  PLT 216 174 159   Basic Metabolic Panel: Recent Labs  Lab 11/26/23 2000 11/27/23 0011 11/27/23 0500  NA 134* 135 139  K 3.0* 3.4* 3.6  CL 105 108 108  CO2 16* 17* 20*  GLUCOSE 81 82 109*  BUN 41* 39* 32*  CREATININE 1.69* 1.28* 1.28*  CALCIUM 6.6* 6.2* 6.6*  MG 0.8* 2.0  --   PHOS 4.4  --   --    GFR: Estimated Creatinine Clearance: 37.9 mL/min (A) (by C-G formula based on SCr of 1.28 mg/dL (H)). Liver Function Tests: Recent Labs  Lab 11/26/23 2000 11/27/23 0011 11/27/23 0500  AST 37 35 31  ALT 24 21 20   ALKPHOS 29* 26* 21*  BILITOT 0.8 0.9 0.5  PROT 7.3 6.5 6.1*  ALBUMIN 4.0 3.6 3.2*   Recent Labs  Lab  11/26/23 2000  LIPASE 59*   No results for input(s): "AMMONIA" in the last 168 hours. Coagulation Profile: No results for input(s): "INR", "PROTIME" in the last 168 hours. Cardiac Enzymes: No results for input(s): "CKTOTAL", "CKMB", "CKMBINDEX", "TROPONINI" in the last 168 hours. BNP (last 3 results) No results for input(s): "PROBNP" in the last 8760 hours. HbA1C: Recent Labs    11/27/23 0011  HGBA1C 5.3   CBG: Recent Labs  Lab 11/27/23 0009 11/27/23 0823  GLUCAP 73 95   Lipid Profile: No results for input(s): "CHOL", "HDL", "LDLCALC", "TRIG", "CHOLHDL", "LDLDIRECT" in the last 72 hours. Thyroid Function Tests: No results for input(s): "TSH", "T4TOTAL", "FREET4", "T3FREE", "THYROIDAB" in the last 72 hours. Anemia Panel: No results for input(s): "VITAMINB12", "FOLATE", "FERRITIN", "TIBC", "IRON", "RETICCTPCT" in the last 72 hours. Sepsis Labs: No results for input(s): "PROCALCITON", "LATICACIDVEN" in the last 168  hours.  No results found for this or any previous visit (from the past 240 hours).   Radiology Studies: No results found.  Scheduled Meds:  enoxaparin (LOVENOX) injection  40 mg Subcutaneous Q24H   insulin aspart  0-15 Units Subcutaneous TID WC   insulin aspart  0-5 Units Subcutaneous QHS   Continuous Infusions:  0.9 % NaCl with KCl 40 mEq / L 100 mL/hr at 11/27/23 0323     LOS: 1 day   Hughie Closs, MD [facility]  11/27/2023, 11:16 AM   *Please note that this is a verbal dictation therefore any spelling or grammatical errors are due to the "[employer] One" system interpretation.  Please page via Amion and do not message via secure chat for urgent patient care matters. Secure chat can be used for non urgent patient care matters.  How to contact the [facility] Attending or Consulting provider 7A - 7P or covering provider during after hours 7P -7A, for this patient?  Check the care team in [facility] and look for a) attending/consulting [facility] provider listed  and b) the [facility] team listed. Page or secure chat 7A-7P. Log into [email or web address] and use [place]'s universal password to access. If you do not have the password, please contact the hospital operator. Locate the [facility] provider you are looking for under [facility] and page to a number that you can be directly reached. If you still have difficulty reaching the provider, please page the [facility] (Director on Call) for the Hospitalists listed on amion for assistance.

## 2023-11-27 NOTE — ED Notes (Signed)
 Patient ambulated to bathroom.

## 2023-11-27 NOTE — ED Notes (Signed)
Pt ambulatory without assistance.  

## 2023-11-27 NOTE — ED Notes (Signed)
 Patient denies pain and is resting comfortably.

## 2023-11-27 NOTE — Plan of Care (Signed)

## 2023-11-27 NOTE — ED Notes (Signed)
Provided pt with cranberry juice per pt request.

## 2023-11-28 DIAGNOSIS — E878 Other disorders of electrolyte and fluid balance, not elsewhere classified: Secondary | ICD-10-CM | POA: Diagnosis not present

## 2023-11-28 LAB — COMPREHENSIVE METABOLIC PANEL
ALT: 18 U/L (ref 0–44)
AST: 28 U/L (ref 15–41)
Albumin: 3.2 g/dL — ABNORMAL LOW (ref 3.5–5.0)
Alkaline Phosphatase: 19 U/L — ABNORMAL LOW (ref 38–126)
Anion gap: 5 (ref 5–15)
BUN: 15 mg/dL (ref 8–23)
CO2: 21 mmol/L — ABNORMAL LOW (ref 22–32)
Calcium: 7.5 mg/dL — ABNORMAL LOW (ref 8.9–10.3)
Chloride: 116 mmol/L — ABNORMAL HIGH (ref 98–111)
Creatinine, Ser: 0.9 mg/dL (ref 0.44–1.00)
GFR, Estimated: >60 mL/min (ref >60)
Glucose, Bld: 90 mg/dL (ref 70–99)
Potassium: 4.8 mmol/L (ref 3.5–5.1)
Sodium: 142 mmol/L (ref 135–145)
Total Bilirubin: 1 mg/dL (ref 0.0–1.2)
Total Protein: 6.2 g/dL — ABNORMAL LOW (ref 6.5–8.1)

## 2023-11-28 LAB — CBC WITH DIFFERENTIAL/PLATELET
Abs Immature Granulocytes: 0.02 10*3/uL (ref 0.00–0.07)
Basophils Absolute: 0 10*3/uL (ref 0.0–0.1)
Basophils Relative: 1 %
Eosinophils Absolute: 0.1 10*3/uL (ref 0.0–0.5)
Eosinophils Relative: 2 %
HCT: 30.4 % — ABNORMAL LOW (ref 36.0–46.0)
Hemoglobin: 10 g/dL — ABNORMAL LOW (ref 12.0–15.0)
Immature Granulocytes: 1 %
Lymphocytes Relative: 46 %
Lymphs Abs: 1.7 10*3/uL (ref 0.7–4.0)
MCH: 35.6 pg — ABNORMAL HIGH (ref 26.0–34.0)
MCHC: 32.9 g/dL (ref 30.0–36.0)
MCV: 108.2 fL — ABNORMAL HIGH (ref 80.0–100.0)
Monocytes Absolute: 0.3 10*3/uL (ref 0.1–1.0)
Monocytes Relative: 7 %
Neutro Abs: 1.5 10*3/uL — ABNORMAL LOW (ref 1.7–7.7)
Neutrophils Relative %: 43 %
Platelets: 158 10*3/uL (ref 150–400)
RBC: 2.81 MIL/uL — ABNORMAL LOW (ref 3.87–5.11)
RDW: 12.7 % (ref 11.5–15.5)
WBC: 3.6 10*3/uL — ABNORMAL LOW (ref 4.0–10.5)
nRBC: 0 % (ref 0.0–0.2)

## 2023-11-28 LAB — GLUCOSE, CAPILLARY: Glucose-Capillary: 88 mg/dL (ref 70–99)

## 2023-11-28 LAB — MAGNESIUM: Magnesium: 1.6 mg/dL — ABNORMAL LOW (ref 1.7–2.4)

## 2023-11-28 MED ORDER — LEVOTHYROXINE SODIUM 50 MCG PO TABS: 50.0000 ug | ORAL_TABLET | Freq: Every day | ORAL | Status: DC

## 2023-11-28 MED ORDER — LEVOTHYROXINE SODIUM 50 MCG PO TABS
50.0000 ug | ORAL_TABLET | Freq: Every day | ORAL | Status: DC
  Administered 2023-11-28: 50 ug via ORAL
  Filled 2023-11-28: qty 1

## 2023-11-28 MED ORDER — AMLODIPINE BESYLATE 10 MG PO TABS
10.0000 mg | ORAL_TABLET | Freq: Every day | ORAL | Status: DC
  Administered 2023-11-28: 10 mg via ORAL
  Filled 2023-11-28: qty 1

## 2023-11-28 MED ORDER — ATENOLOL 50 MG PO TABS
100.0000 mg | ORAL_TABLET | Freq: Every day | ORAL | Status: DC
  Administered 2023-11-28: 100 mg via ORAL
  Filled 2023-11-28: qty 2

## 2023-11-28 MED ORDER — MAGNESIUM SULFATE 2 GM/50ML IV SOLN
2.0000 g | Freq: Once | INTRAVENOUS | Status: AC
  Administered 2023-11-28: 2 g via INTRAVENOUS
  Filled 2023-11-28: qty 50

## 2023-11-28 NOTE — Discharge Summary (Signed)
 Physician Discharge Summary  Darlene Saunders ZHY:[id number] DOB: 09/07/1956 DOA: 11/26/2023  PCP: Merri Brunette, MD  Admit date: 11/26/2023 Discharge date: 11/28/2023 30 Day Unplanned Readmission Risk Score    Flowsheet Row ED to Hosp-Admission (Current) from 11/26/2023 in [facility]  30 Day Unplanned Readmission Risk Score (%) 16.71 Filed at 11/28/2023 0400       This score is the patient's risk of an unplanned readmission within 30 days of being discharged (0 -100%). The score is based on dignosis, age, lab data, medications, orders, and past utilization.   Low:  0-14.9   Medium: 15-21.9   High: 22-29.9   Extreme: 30 and above          Admitted From: Home Disposition: Home  Recommendations for Outpatient Follow-up:  Follow up with PCP in 1-2 weeks Please obtain BMP/CBC in one week Please follow up with your PCP on the following pending results: Unresulted Labs (From admission, onward)     Start     Ordered   12/03/23 0500  Creatinine, serum  (enoxaparin (LOVENOX)    CrCl >/= 30 ml/min)  Weekly,   R     Comments: while on enoxaparin therapy    11/27/23 0000   11/28/23 0500  CBC with Differential/Platelet  Tomorrow morning,   R        11/27/23 1123   11/28/23 0500  Comprehensive metabolic panel  Tomorrow morning,   R        11/27/23 1123   11/28/23 0500  Magnesium  Tomorrow morning,   R        11/27/23 1123              Home Health: None Equipment/Devices: None  Discharge Condition: Stable CODE STATUS: Full code Diet recommendation: Diabetic  Please refer to HPI by admitting hospitalist. HPI: Darlene Saunders is a 68 y.o. female with medical history significant of essential hypertension, type 2 diabetes, hyperlipidemia, hypothyroidism, chronic kidney disease stage III,Remote tobacco abuse, abdominal arctic aneurysm and peripheral vascular disease, GERD among other things who presented to the ER with persistent nausea.  Also vomiting for about 5 days.   Patient apparently had acute viral illness that appears like flu last month around the middle of February.  Following that she had pneumonia that was treated with antibiotics.  Also steroids.  Symptoms have resolved over 5 days ago but continues to have nausea.  Also has occasional vomiting.  Came to the ER today and was found to have significant electrolyte abnormalities after going initially to urgent care center.  Patient is symptomatic with some mild symptoms of tetany and was found to be hypocalcemic.  Also hypomagnesemic and hypokalemic.  She is being admitted for management of her electrolyte abnormalities.    Subjective: Seen and examined, feels well.  No more nausea or vomiting.  Feels comfortable going home today.  Brief/Interim Summary: Patient was admitted for the following.  severe hypomagnesemia/hypokalemia/hypocalcemia: Together with hypokalemia and hypocalcemia.  More than likely secondary patient has symptoms last week with ongoing nausea vomiting.  Magnesium 0.8 upon arrival, replenished and normal yesterday but 1.6 today, she will receive IV replacement today before discharge., potassium replenished and normal, calcium replenished and getting better as well.  Recommended taking over-the-counter calcium supplements.   diabetes melitis type II: Non-insulin-dependent.  Treated with SSI here.   essential hypertension: On amlodipine, Altace and atenolol.  We held all of them due to her inability to eat or drink but we  are resuming at discharge now.   persistent nausea with vomiting: Patient seem to be taking Reglan from home.  She is diabetic and could have a component of gastroparesis as we have seen couple of cases where viral infection may cause temporary gastroparesis.  We treated her symptomatically and her symptoms have resolved now.  She is tolerating regular diet.   #10: AKI on CKD stage IIIa with mild metabolic acidosis: Baseline creatinine around 1.2-1.3, presented with 1.7 due  to dehydration, which improved to 1.3, back to baseline.  Also came in with CO2 of 16 which improved as well.  Discharge plan was discussed with patient and/or family member and they verbalized understanding and agreed with it.  Discharge Diagnoses:  Principal Problem:   Electrolyte abnormality Active Problems:   Mixed hyperlipidemia   Type 2 diabetes mellitus without complication (HCC)   Hypertension   Anxiety   Hypothyroidism   Stage 3a chronic kidney disease (HCC)   Hypokalemia   Hypocalcemia   Hypomagnesemia    Discharge Instructions   Allergies as of 11/28/2023       Reactions   Dexilant [dexlansoprazole] Diarrhea   Crestor [rosuvastatin]    Muscle aches on 5mg  3x/week   Hctz [hydrochlorothiazide]    Rash   Levofloxacin    Other reaction(s): myalgias   Lipitor [atorvastatin]    Muscle aches   Livalo [pitavastatin]    Myalgias on 2-4mg  daily   Penicillins    Pravastatin    Muscle aches on 40mg  daily   Sulfamethoxazole-trimethoprim Hives   Augmentin [amoxicillin-pot Clavulanate] Hives   Cipro Hc [ciprofloxacin-hydrocortisone]    Topical reaction        Medication List     STOP taking these medications    aspirin EC 81 MG tablet   azithromycin 250 MG tablet Commonly known as: ZITHROMAX   doxycycline 100 MG tablet Commonly known as: VIBRA-TABS       TAKE these medications    acyclovir 400 MG tablet Commonly known as: ZOVIRAX Take 400 mg by mouth 2 (two) times daily.   ALPRAZolam 0.5 MG tablet Commonly known as: XANAX Take 0.5 mg by mouth at bedtime.   amLODipine 10 MG tablet Commonly known as: NORVASC Take 10 mg by mouth daily.   atenolol 100 MG tablet Commonly known as: TENORMIN Take 100 mg by mouth daily.   Biotin 5000 MCG Caps Take 1 capsule by mouth daily.   colestipol 1 g tablet Commonly known as: COLESTID Take 2 g by mouth in the morning, at noon, and at bedtime.   dicyclomine 20 MG tablet Commonly known as: BENTYL Take  1-2 tablets by mouth daily as needed for spasms.   esomeprazole 40 MG capsule Commonly known as: NEXIUM Take 40 mg by mouth 2 (two) times daily before a meal.   estradiol 2 MG tablet Commonly known as: ESTRACE Take 2 mg by mouth daily.   fenofibrate 160 MG tablet TAKE 1 TABLET BY MOUTH DAILY   icosapent Ethyl 1 g capsule Commonly known as: Vascepa Take 2 capsules (2 g total) by mouth 2 (two) times daily.   indapamide 2.5 MG tablet Commonly known as: LOZOL Take 2.5 mg by mouth daily.   levothyroxine 50 MCG tablet Commonly known as: SYNTHROID Take 50 mcg by mouth daily before breakfast.   metoCLOPramide 10 MG tablet Commonly known as: REGLAN Take 10 mg by mouth as needed for nausea (Take one tablet every 4 to 6 hours with migraine headaches as directed for her migrains).  predniSONE 20 MG tablet Commonly known as: DELTASONE Take 20 mg by mouth as directed.   ramipril 10 MG capsule Commonly known as: ALTACE TAKE 1 CAPSULE(10 MG) BY MOUTH TWICE DAILY   Repatha SureClick 140 MG/ML Soaj Generic drug: Evolocumab INJECT UNDER THE SKIN EVERY 14 DAYS   sertraline 100 MG tablet Commonly known as: ZOLOFT Take 100 mg by mouth daily.   spironolactone 25 MG tablet Commonly known as: ALDACTONE Take 12.5 mg by mouth daily.   tiZANidine 4 MG tablet Commonly known as: ZANAFLEX Take 4 mg by mouth as needed for muscle spasms.        Follow-up Information     Merri Brunette, MD Follow up in 1 week(s).   Specialty: Family Medicine Contact information: [address] [place] [address] [phone]                Allergies  Allergen Reactions   Dexilant [Dexlansoprazole] Diarrhea   Crestor [Rosuvastatin]     Muscle aches on 5mg  3x/week   Hctz [Hydrochlorothiazide]     Rash   Levofloxacin     Other reaction(s): myalgias   Lipitor [Atorvastatin]     Muscle aches   Livalo [Pitavastatin]     Myalgias on 2-4mg  daily   Penicillins     Pravastatin     Muscle aches on 40mg  daily   Sulfamethoxazole-Trimethoprim Hives   Augmentin [Amoxicillin-Pot Clavulanate] Hives   Cipro Hc [Ciprofloxacin-Hydrocortisone]     Topical reaction    Consultations: None   Procedures/Studies: No results found.   Discharge Exam: Vitals:   11/27/23 2211 11/28/23 0511  BP: 136/73 (!) 168/88  Pulse: 66 64  Resp: 18   Temp: 97.8 F (36.6 C) (!) 97.3 F (36.3 C)  SpO2: 99% 100%   Vitals:   11/27/23 1450 11/27/23 1828 11/27/23 2211 11/28/23 0511  BP: (!) 142/72 124/60 136/73 (!) 168/88  Pulse: 63 66 66 64  Resp: 18 20 18    Temp: 98 F (36.7 C) (!) 97.5 F (36.4 C) 97.8 F (36.6 C) (!) 97.3 F (36.3 C)  TempSrc: Oral Oral Oral Oral  SpO2: 100% 100% 99% 100%  Weight:      Height:        General: Pt is alert, awake, not in acute distress Cardiovascular: RRR, S1/S2 +, no rubs, no gallops Respiratory: CTA bilaterally, no wheezing, no rhonchi Abdominal: Soft, NT, ND, bowel sounds + Extremities: no edema, no cyanosis    The results of significant diagnostics from this hospitalization (including imaging, microbiology, ancillary and laboratory) are listed below for reference.     Microbiology: No results found for this or any previous visit (from the past 240 hours).   Labs: BNP (last 3 results) No results for input(s): "BNP" in the last 8760 hours. Basic Metabolic Panel: Recent Labs  Lab 11/26/23 2000 11/27/23 0011 11/27/23 0500  NA 134* 135 139  K 3.0* 3.4* 3.6  CL 105 108 108  CO2 16* 17* 20*  GLUCOSE 81 82 109*  BUN 41* 39* 32*  CREATININE 1.69* 1.28* 1.28*  CALCIUM 6.6* 6.2* 6.6*  MG 0.8* 2.0  --   PHOS 4.4  --   --    Liver Function Tests: Recent Labs  Lab 11/26/23 2000 11/27/23 0011 11/27/23 0500  AST 37 35 31  ALT 24 21 20   ALKPHOS 29* 26* 21*  BILITOT 0.8 0.9 0.5  PROT 7.3 6.5 6.1*  ALBUMIN 4.0 3.6 3.2*   Recent Labs  Lab 11/26/23 2000  LIPASE 59*   No results for input(s): "AMMONIA" in  the last 168 hours. CBC: Recent Labs  Lab 11/26/23 2000 11/27/23 0011 11/27/23 0500  WBC 10.2 6.7 5.0  NEUTROABS 5.0  --   --   HGB 12.0 10.6* 10.2*  HCT 37.3 31.4* 31.3*  MCV 109.7* 105.0* 107.9*  PLT 216 174 159   Cardiac Enzymes: No results for input(s): "CKTOTAL", "CKMB", "CKMBINDEX", "TROPONINI" in the last 168 hours. BNP: Invalid input(s): "POCBNP" CBG: Recent Labs  Lab 11/27/23 0009 11/27/23 0823 11/27/23 1227 11/27/23 1732 11/27/23 2056  GLUCAP 73 95 105* 75 100*   D-Dimer No results for input(s): "DDIMER" in the last 72 hours. Hgb A1c Recent Labs    11/27/23 0011  HGBA1C 5.3   Lipid Profile No results for input(s): "CHOL", "HDL", "LDLCALC", "TRIG", "CHOLHDL", "LDLDIRECT" in the last 72 hours. Thyroid function studies No results for input(s): "TSH", "T4TOTAL", "T3FREE", "THYROIDAB" in the last 72 hours.  Invalid input(s): "FREET3" Anemia work up No results for input(s): "VITAMINB12", "FOLATE", "FERRITIN", "TIBC", "IRON", "RETICCTPCT" in the last 72 hours. Urinalysis    Component Value Date/Time   COLORURINE YELLOW 11/26/2023 2229   APPEARANCEUR CLEAR 11/26/2023 2229   LABSPEC 1.011 11/26/2023 2229   PHURINE 5.0 11/26/2023 2229   GLUCOSEU NEGATIVE 11/26/2023 2229   HGBUR NEGATIVE 11/26/2023 2229   BILIRUBINUR NEGATIVE 11/26/2023 2229   KETONESUR NEGATIVE 11/26/2023 2229   PROTEINUR NEGATIVE 11/26/2023 2229   NITRITE NEGATIVE 11/26/2023 2229   LEUKOCYTESUR NEGATIVE 11/26/2023 2229   Sepsis Labs Recent Labs  Lab 11/26/23 2000 11/27/23 0011 11/27/23 0500  WBC 10.2 6.7 5.0   Microbiology No results found for this or any previous visit (from the past 240 hours).  FURTHER DISCHARGE INSTRUCTIONS:   Get Medicines reviewed and adjusted: Please take all your medications with you for your next visit with your Primary MD   Laboratory/radiological data: Please request your Primary MD to go over all hospital tests and procedure/radiological results  at the follow up, please ask your Primary MD to get all Hospital records sent to his/her office.   In some cases, they will be blood work, cultures and biopsy results pending at the time of your discharge. Please request that your primary care M.D. goes through all the records of your hospital data and follows up on these results.   Also Note the following: If you experience worsening of your admission symptoms, develop shortness of breath, life threatening emergency, suicidal or homicidal thoughts you must seek medical attention immediately by calling 911 or calling your MD immediately  if symptoms less severe.   You must read complete instructions/literature along with all the possible adverse reactions/side effects for all the Medicines you take and that have been prescribed to you. Take any new Medicines after you have completely understood and accpet all the possible adverse reactions/side effects.    Do not drive when taking Pain medications or sleeping medications (Benzodaizepines)   Do not take more than prescribed Pain, Sleep and Anxiety Medications. It is not advisable to combine anxiety,sleep and pain medications without talking with your primary care practitioner   Special Instructions: If you have smoked or chewed Tobacco  in the last 2 yrs please stop smoking, stop any regular Alcohol  and or any Recreational drug use.   Wear Seat belts while driving.   Please note: You were cared for by a hospitalist during your hospital stay. Once you are discharged, your primary care physician will handle  any further medical issues. Please note that NO REFILLS for any discharge medications will be authorized once you are discharged, as it is imperative that you return to your primary care physician (or establish a relationship with a primary care physician if you do not have one) for your post hospital discharge needs so that they can reassess your need for medications and monitor your lab  values  Time coordinating discharge: Over 30 minutes  SIGNED:   Hughie Closs, MD  [facility] 11/28/2023, 7:31 AM *Please note that this is a verbal dictation therefore any spelling or grammatical errors are due to the "Dragon Medical One" system interpretation. If 7PM-7AM, please contact night-coverage [email or web address]

## 2023-11-28 NOTE — Plan of Care (Signed)

## 2023-12-09 ENCOUNTER — Encounter: Payer: Self-pay | Admitting: Physician Assistant

## 2023-12-09 ENCOUNTER — Ambulatory Visit: Payer: Medicare Other | Attending: Physician Assistant | Admitting: Physician Assistant

## 2023-12-09 VITALS — BP 120/74 | HR 61 | Ht 66.0 in | Wt 124.0 lb

## 2023-12-09 DIAGNOSIS — Z720 Tobacco use: Secondary | ICD-10-CM

## 2023-12-09 DIAGNOSIS — I6523 Occlusion and stenosis of bilateral carotid arteries: Secondary | ICD-10-CM | POA: Insufficient documentation

## 2023-12-09 DIAGNOSIS — I10 Essential (primary) hypertension: Secondary | ICD-10-CM | POA: Diagnosis not present

## 2023-12-09 DIAGNOSIS — E782 Mixed hyperlipidemia: Secondary | ICD-10-CM | POA: Diagnosis not present

## 2023-12-09 DIAGNOSIS — I7143 Infrarenal abdominal aortic aneurysm, without rupture: Secondary | ICD-10-CM | POA: Insufficient documentation

## 2023-12-09 DIAGNOSIS — I739 Peripheral vascular disease, unspecified: Secondary | ICD-10-CM | POA: Insufficient documentation

## 2023-12-09 MED ORDER — ICOSAPENT ETHYL 1 G PO CAPS: 2.0000 g | ORAL_CAPSULE | Freq: Two times a day (BID) | ORAL | 3 refills | Status: AC

## 2023-12-09 NOTE — Progress Notes (Signed)
 Cardiology Office Note:    Date:  12/09/2023  ID:  Darlene Saunders, DOB 1956-05-25, MRN [id number] PCP: Merri Brunette, MD  [facility] Providers Cardiologist:  Donato Schultz, MD       Patient Profile:      TTE 06/13/21: EF 60-65, no RWMA, moderate asymmetric LVH, GLS -24.3, normal RVSF, normal PASP, mild LAE, trivial MR, moderate to severe MAC, no mitral stenosis, mild AV calcification, trivial AI, mild AV sclerosis without stenosis, RAP 3, RVSP 23.2  Peripheral arterial disease  Prior US with celiac and mesenteric artery stenosis  Eval w [facility] in 2022: IMA and SMA w 70-99% on [place] (CT w patent celiac, IMA, SMA Renal US in 2021: no Renal artery stenosis  [place] in 05/2021: R subclavian stenosis; bilat subclavian flow disturbed  Carotid artery disease [place] 06/26/22: 1-39 bilat ICA  [place] 07/06/23: Bilat ICA 1-39  Abdominal aortic aneurysm  [place] in 2021: 3.2 cm  CT in 2022: 3.2 cm  Followed by [facility] - Dr. Randie Heinz  [place] 08/25/23: 3.2 cm  Hyperlipidemia w severe hypertriglyceridemia  Intol of statins  Insurance previously denied [id number] Hypertension  Allergy to HCTZ (rash) RA [place] 05/23/20: Bilat RA patent  Chronic kidney disease  Diabetes mellitus  GERD Aortic atherosclerosis  Left eye Hollenhorst plaque Carotid US 10/23: Bilateral ICA 1-39          Discussed the use of AI scribe software for clinical note transcription with the patient, who gave verbal consent to proceed.  History of Present Illness Darlene Saunders is a 68 y.o. female who returns for follow up of vascular disease, HTN, HL. She was last seen in 06/2022.   She is here alone. She notes discomfort in her left arm, describing it as a burning sensation that occurs when she is not using it, sometimes waking her up at night. She has not had exertional L arm pain. No pain or discomfort in her right arm during use, dizziness, or syncope. No chest discomfort, shortness of breath, or leg swelling. She has a history of tobacco use, smoking very  little currently. She has a 68 year old at [place]. He is looking into going to [place] (where the pt went to college) vs [employer].   ROS-See HPI    Studies Reviewed:   EKG Interpretation Date/Time:  Wednesday December 09 2023 14:02:03 EDT Ventricular Rate:  61 PR Interval:  146 QRS Duration:  78 QT Interval:  396 QTC Calculation: 398 R Axis:   67  Text Interpretation: Normal sinus rhythm Left ventricular hypertrophy No significant change since last tracing Confirmed by Tereso Newcomer [phone]) on 12/09/2023 2:16:26 PM    Results LABS Lipid panel: TC 69, HDL 38, LDL 12, triglycerides 101 (03/05/2023)     Risk Assessment/Calculations:             Physical Exam:   VS:  BP 120/74 (BP Location: Right Arm, Patient Position: Sitting, Cuff Size: Normal)   Pulse 61   Ht 5\' 6"  (1.676 m)   Wt 124 lb (56.2 kg)   SpO2 98%   BMI 20.01 kg/m    Wt Readings from Last 3 Encounters:  12/09/23 124 lb (56.2 kg)  11/27/23 119 lb (54 kg)  08/26/23 125 lb 9.6 oz (57 kg)    Constitutional:      Appearance: Healthy appearance. Not in distress.  Neck:     Vascular: JVD normal.  Pulmonary:     Breath sounds: Normal breath sounds. No wheezing. No  rales.  Cardiovascular:     Normal rate. Regular rhythm.     Murmurs: There is no murmur.  Edema:    Peripheral edema absent.  Abdominal:     Palpations: Abdomen is soft.        Assessment and Plan:   Assessment & Plan Peripheral arterial disease She has peripheral arterial disease with celiac and mesenteric artery stenosis identified on prior Doppler study.   - Continue monitoring by vascular surgery.  Abdominal aortic aneurysm She has a small abdominal aortic aneurysm measuring 3.2 cm by the most recent Doppler study in December 2024.  - Continue monitoring by vascular surgery.  Carotid artery disease Carotid ultrasound on July 06, 2023 with bilateral ICA 1-39% stenosis.  - Consider repeat carotid ultrasound in 1 year.  Hypertension Her  hypertension is well-controlled with the current medication regimen. TTE in 05/2021 with mod asymmetric LVH. - Continue amlodipine 10 mg daily, atenolol 100 mg daily, indapamide 2.5 mg daily, ramipril 10 mg twice daily, spironolactone 12.5 mg daily.  Hyperlipidemia Her hyperlipidemia is well-managed with current medications. Recent labs from June 2024 show LDL at 12, which is optimal. Triglycerides were normal at 86 in December 2023 and 101 in June 2024. She is on Repatha, fenofibrate, and Vascepa, effectively managing lipid levels. - Continue current lipid-lowering medications: Repatha 140 mg every 14 days, fenofibrate 160 mg daily, Vascepa 2 g twice daily. - Refill Vascepa today.  Tobacco use She smokes very little currently.  - Recommend smoking cessation.       Dispo:  Return in about 1 year (around 12/08/2024) for Routine Follow Up, w/ Dr. Anne Fu.  Signed, Tereso Newcomer, PA-C

## 2023-12-09 NOTE — Patient Instructions (Signed)
 Medication Instructions:  Your physician recommends that you continue on your current medications as directed. Please refer to the Current Medication list given to you today.  *If you need a refill on your cardiac medications before your next appointment, please call your pharmacy*   Lab Work: None ordered  If you have labs (blood work) drawn today and your tests are completely normal, you will receive your results only by: MyChart Message (if you have MyChart) OR A paper copy in the mail If you have any lab test that is abnormal or we need to change your treatment, we will call you to review the results.   Testing/Procedures: None ordered   Follow-Up: At [facility], you and your health needs are our priority.  As part of our continuing mission to provide you with exceptional heart care, we have created designated Provider Care Teams.  These Care Teams include your primary Cardiologist (physician) and Advanced Practice Providers (APPs -  Physician Assistants and Nurse Practitioners) who all work together to provide you with the care you need, when you need it.  We recommend signing up for the patient portal called "MyChart".  Sign up information is provided on this After Visit Summary.  MyChart is used to connect with patients for Virtual Visits (Telemedicine).  Patients are able to view lab/test results, encounter notes, upcoming appointments, etc.  Non-urgent messages can be sent to your provider as well.   To learn more about what you can do with MyChart, go to [email or web address].    Your next appointment:   1 year(s)  Provider:   Donato Schultz, MD     Other Instructions      1st Floor: - Lobby - Registration  - Pharmacy  - Lab - Cafe  2nd Floor: - PV Lab - Diagnostic Testing (echo, CT, nuclear med)  3rd Floor: - Vacant  4th Floor: - TCTS (cardiothoracic surgery) - AFib Clinic - Structural Heart Clinic - Vascular Surgery  - Vascular  Ultrasound  5th Floor: - HeartCare Cardiology (general and EP) - Clinical Pharmacy for coumadin, hypertension, lipid, weight-loss medications, and med management appointments    Valet parking services will be available as well.

## 2023-12-24 ENCOUNTER — Other Ambulatory Visit: Payer: Self-pay | Admitting: Physician Assistant

## 2024-02-24 ENCOUNTER — Emergency Department (HOSPITAL_COMMUNITY)
Admission: EM | Admit: 2024-02-24 | Discharge: 2024-02-24 | Disposition: A | Discharge status: Home / Self Care | Attending: Emergency Medicine | Admitting: Emergency Medicine

## 2024-02-24 ENCOUNTER — Other Ambulatory Visit: Payer: Self-pay

## 2024-02-24 DIAGNOSIS — E871 Hypo-osmolality and hyponatremia: Secondary | ICD-10-CM | POA: Insufficient documentation

## 2024-02-24 DIAGNOSIS — R197 Diarrhea, unspecified: Secondary | ICD-10-CM | POA: Diagnosis present

## 2024-02-24 LAB — COMPREHENSIVE METABOLIC PANEL WITH GFR
ALT: 16 U/L (ref 0–44)
AST: 29 U/L (ref 15–41)
Albumin: 4.2 g/dL (ref 3.5–5.0)
Alkaline Phosphatase: 32 U/L — ABNORMAL LOW (ref 38–126)
Anion gap: 10 (ref 5–15)
BUN: 33 mg/dL — ABNORMAL HIGH (ref 8–23)
CO2: 20 mmol/L — ABNORMAL LOW (ref 22–32)
Calcium: 9.5 mg/dL (ref 8.9–10.3)
Chloride: 100 mmol/L (ref 98–111)
Creatinine, Ser: 1.3 mg/dL — ABNORMAL HIGH (ref 0.44–1.00)
GFR, Estimated: 45 mL/min — ABNORMAL LOW (ref >60)
Glucose, Bld: 150 mg/dL — ABNORMAL HIGH (ref 70–99)
Potassium: 4.1 mmol/L (ref 3.5–5.1)
Sodium: 130 mmol/L — ABNORMAL LOW (ref 135–145)
Total Bilirubin: 0.4 mg/dL (ref 0.0–1.2)
Total Protein: 7.6 g/dL (ref 6.5–8.1)

## 2024-02-24 LAB — CBC
HCT: 34.2 % — ABNORMAL LOW (ref 36.0–46.0)
Hemoglobin: 11.5 g/dL — ABNORMAL LOW (ref 12.0–15.0)
MCH: 36.9 pg — ABNORMAL HIGH (ref 26.0–34.0)
MCHC: 33.6 g/dL (ref 30.0–36.0)
MCV: 109.6 fL — ABNORMAL HIGH (ref 80.0–100.0)
Platelets: 280 10*3/uL (ref 150–400)
RBC: 3.12 MIL/uL — ABNORMAL LOW (ref 3.87–5.11)
RDW: 11.9 % (ref 11.5–15.5)
WBC: 8 10*3/uL (ref 4.0–10.5)
nRBC: 0 % (ref 0.0–0.2)

## 2024-02-24 LAB — MAGNESIUM: Magnesium: 1.7 mg/dL (ref 1.7–2.4)

## 2024-02-24 MED ORDER — SODIUM CHLORIDE 0.9 % IV BOLUS
1000.0000 mL | Freq: Once | INTRAVENOUS | Status: AC
  Administered 2024-02-24: 1000 mL via INTRAVENOUS

## 2024-02-24 NOTE — Discharge Instructions (Signed)
 Follow up with your family doc in the office.   Try to drink a bit more fluid with salt in it like Gatorade or Pedialyte.  Please follow-up with your doctor for recheck.

## 2024-02-24 NOTE — ED Triage Notes (Signed)
 Pt has chronic diarrhea x 2 years- was sent here today for low sodium.

## 2024-02-24 NOTE — ED Provider Notes (Signed)
 [place] EMERGENCY DEPARTMENT AT [facility] Provider Note   CSN: [id number] Arrival date & time: 02/24/24  1701     History  Chief Complaint  Patient presents with   Diarrhea    Darlene Saunders is a 68 y.o. female.  68 yo F with a chief complaints of diarrhea.  Patient has had this off and on for couple years.  Was diagnosed with IBS.  She takes medicine for this that sometimes makes it better and sometimes does not.  She had seen her doctor recently and they check blood work they were concerned about her sodium level and told her she should come to the ED for evaluation.   Diarrhea      Home Medications Prior to Admission medications   Medication Sig Start Date End Date Taking? Authorizing Provider  acyclovir (ZOVIRAX) 400 MG tablet Take 400 mg by mouth 2 (two) times daily. 09/20/19   [provider]  ALPRAZolam  (XANAX ) 0.5 MG tablet Take 0.5 mg by mouth at bedtime.  06/09/15   [provider]  amLODipine  (NORVASC ) 10 MG tablet Take 10 mg by mouth daily.    [provider]  atenolol  (TENORMIN ) 100 MG tablet Take 100 mg by mouth daily.  12/11/13   [provider]  Biotin 5000 MCG CAPS Take 1 capsule by mouth daily.    [provider]  colestipol (COLESTID) 1 g tablet Take 2 g by mouth in the morning, at noon, and at bedtime. 06/15/21   [provider]  dicyclomine (BENTYL) 20 MG tablet Take 1-2 tablets by mouth daily as needed for spasms. 04/14/22   [provider]  esomeprazole (NEXIUM) 40 MG capsule Take 40 mg by mouth 2 (two) times daily before a meal.    [provider]  estradiol (ESTRACE) 2 MG tablet Take 2 mg by mouth daily. 12/28/19   [provider]  fenofibrate  160 MG tablet TAKE 1 TABLET BY MOUTH DAILY 12/25/23   Hugh Madura, MD  icosapent  Ethyl (VASCEPA ) 1 g capsule Take 2 capsules (2 g total) by mouth 2 (two) times daily. 12/09/23   Marlyse Single T, PA-C  indapamide  (LOZOL ) 2.5 MG  tablet Take 2.5 mg by mouth daily.    [provider]  levothyroxine  (SYNTHROID , LEVOTHROID) 50 MCG tablet Take 50 mcg by mouth daily before breakfast.  12/11/13   [provider]  metoCLOPramide (REGLAN) 10 MG tablet Take 10 mg by mouth as needed for nausea (Take one tablet every 4 to 6 hours with migraine headaches as directed for her migrains).    [provider]  predniSONE (DELTASONE) 20 MG tablet Take 20 mg by mouth as directed. 10/30/23   [provider]  ramipril  (ALTACE ) 10 MG capsule TAKE 1 CAPSULE(10 MG) BY MOUTH TWICE DAILY 07/08/22   Marlyse Single T, PA-C  REPATHA  SURECLICK 140 MG/ML SOAJ INJECT UNDER THE SKIN EVERY 14 DAYS Patient taking differently: Inject 140 mg into the skin See admin instructions. Patient injects either Sun-Mon 06/30/23   Marlyse Single T, PA-C  sertraline  (ZOLOFT ) 100 MG tablet Take 100 mg by mouth daily.  12/11/13   [provider]  spironolactone  (ALDACTONE ) 25 MG tablet Take 12.5 mg by mouth daily.    [provider]  tiZANidine  (ZANAFLEX ) 4 MG tablet Take 4 mg by mouth as needed for muscle spasms. 06/12/21   [provider]      Allergies    Dexilant [dexlansoprazole], Crestor  [rosuvastatin ], Hctz [hydrochlorothiazide],  Levofloxacin, Lipitor [atorvastatin], Livalo  [pitavastatin ], Penicillins, Pravastatin , Sulfamethoxazole-trimethoprim, Augmentin [amoxicillin-pot clavulanate], and Cipro hc [ciprofloxacin-hydrocortisone]    Review of Systems   Review of Systems  Gastrointestinal:  Positive for diarrhea.    Physical Exam Updated Vital Signs BP (!) 132/105 (BP Location: Left Arm)   Pulse 63   Temp 99.4 F (37.4 C) (Oral)   Resp 16   SpO2 100%  Physical Exam Vitals and nursing note reviewed.  Constitutional:      General: She is not in acute distress.    Appearance: She is well-developed. She is not diaphoretic.  HENT:     Head: Normocephalic and atraumatic.  Eyes:     Pupils: Pupils are  equal, round, and reactive to light.  Cardiovascular:     Rate and Rhythm: Normal rate and regular rhythm.     Heart sounds: No murmur heard.    No friction rub. No gallop.  Pulmonary:     Effort: Pulmonary effort is normal.     Breath sounds: No wheezing or rales.  Abdominal:     General: There is no distension.     Palpations: Abdomen is soft.     Tenderness: There is no abdominal tenderness.  Musculoskeletal:        General: No tenderness.     Cervical back: Normal range of motion and neck supple.  Skin:    General: Skin is warm and dry.  Neurological:     Mental Status: She is alert and oriented to person, place, and time.  Psychiatric:        Behavior: Behavior normal.     ED Results / Procedures / Treatments   Labs (all labs ordered are listed, but only abnormal results are displayed) Labs Reviewed  COMPREHENSIVE METABOLIC PANEL WITH GFR - Abnormal; Notable for the following components:      Result Value   Sodium 130 (*)    CO2 20 (*)    Glucose, Bld 150 (*)    BUN 33 (*)    Creatinine, Ser 1.30 (*)    Alkaline Phosphatase 32 (*)    GFR, Estimated 45 (*)    All other components within normal limits  CBC - Abnormal; Notable for the following components:   RBC 3.12 (*)    Hemoglobin 11.5 (*)    HCT 34.2 (*)    MCV 109.6 (*)    MCH 36.9 (*)    All other components within normal limits  MAGNESIUM     EKG None  Radiology No results found.  Procedures Procedures    Medications Ordered in ED Medications  sodium chloride  0.9 % bolus 1,000 mL (0 mLs Intravenous Stopped 02/24/24 1824)    ED Course/ Medical Decision Making/ A&P                                 Medical Decision Making Amount and/or Complexity of Data Reviewed Labs: ordered.   68 yo F with a chief complaints of having a low sodium checked in the office.  She has had diarrhea off and on for a couple years.  She been having cramps in her legs and so went to her doctor and was found of a  sodium of 130.  She was then sent here for evaluation.  Given a bolus of IV fluids as she appears clinically dehydrated.  She is feeling a bit better.  Labs here similar to those checked in the office.  Patient's sodium is above 125 without any confusion or seizures.  Will treat have her follow-up with PCP in the office.  7:45 PM:  I have discussed the diagnosis/risks/treatment options with the patient.  Evaluation and diagnostic testing in the emergency department does not suggest an emergent condition requiring admission or immediate intervention beyond what has been performed at this time.  They will follow up with PCP. We also discussed returning to the ED immediately if new or worsening sx occur. We discussed the sx which are most concerning (e.g., sudden worsening pain, fever, inability to tolerate by mouth) that necessitate immediate return. Medications administered to the patient during their visit and any new prescriptions provided to the patient are listed below.  Medications given during this visit Medications  sodium chloride  0.9 % bolus 1,000 mL (0 mLs Intravenous Stopped 02/24/24 1824)     The patient appears reasonably screen and/or stabilized for discharge and I doubt any other medical condition or other [facility] requiring further screening, evaluation, or treatment in the ED at this time prior to discharge.          Final Clinical Impression(s) / ED Diagnoses Final diagnoses:  Diarrhea, unspecified type  Hyponatremia    Rx / DC Orders ED Discharge Orders     None         Albertus Hughs, DO 02/24/24 1945

## 2024-02-29 ENCOUNTER — Telehealth: Payer: Self-pay | Admitting: Neurology

## 2024-02-29 NOTE — Telephone Encounter (Signed)
 Pt called to cancel appt . Pt states doesn't need appt anymore   Appt Canceled

## 2024-03-17 MED FILL — Sodium Chloride IV Soln 0.9%: INTRAVENOUS | Qty: 1000 | Status: AC

## 2024-04-13 ENCOUNTER — Ambulatory Visit: Admitting: Neurology

## 2024-04-27 NOTE — Progress Notes (Unsigned)
 Cardiology Office Note    Date:  04/28/2024  ID:  DELILIAH SPRANGER, DOB 1956/08/10, MRN [id number] PCP:  Claudene Pellet, MD  Cardiologist:  Oneil Parchment, MD  Electrophysiologist:  None   Chief Complaint: Lower extremity pain and abdominal pain  History of Present Illness: .    Darlene Saunders is a 68 y.o. female with visit-pertinent history of PAD, AAA, hyperlipidemia with severe hypertriglyceridemia, chronic headaches, back pain, CKD 3A, cataracts, DM, esophageal reflux.  On chart review patient has been primarily followed by our clinic for risk factor modification.  She had a prior AAA duplex in 2019 without evidence of aneurysm but did demonstrate mesenteric and celiac stenosis, was recommended VVS but did not happen at that time.  Renal duplex in 05/2020 showed 3.2 cm AAA plus aortic atherosclerosis, bilateral renal cysts, no RAS.  In 2022 she was evaluated by the [facility], IMA and SMA with 70-99% on ultrasound, CT with patent celiac, IMA, SMA, recommended 2-year follow-up.  She has also been followed by Dr. Sheree for abdominal aortic aneurysm, ultrasound on 08/25/2023 indicated 3.2 cm.  Patient was last seen in clinic on 12/09/2023 by Glendia Ferrier, PA.  Patient noted discomfort in her left arm, described as a burning sensation that occurred when she was not using it, sometimes waking her up at night.  She denied any exertional left arm pain.  No recommended changes were made at that time.  1 year follow-up was recommended.  Today she presents regarding increased bilateral lower extremity pain that has been present for the last few months. Reports when she walks her legs feels as though they are cramping with increased pain, resolves with rest. She also endorses increased abdominal pain, localized to right lower quadrant, that has been present for the last month. Patient notes history of chronic abdominal pain however notes this feels different and is a constant ache. She also has history of IBS, with frequent  diarrhea. Patient reports a 40 lb weight loss in the last year. Reports she has not been eating frequently. She denies chest pain, lower extremity edema, shortness of breath, palpitations, orthopnea or pnd. She denies any presyncope or syncope.  ROS: .   Today she denies chest pain, shortness of breath, lower extremity edema, fatigue, palpitations, melena, hematuria, hemoptysis, diaphoresis, weakness, presyncope, syncope, orthopnea, and PND.  All other systems are reviewed and otherwise negative. Studies Reviewed: SABRA   EKG:  EKG is not ordered today.  CV Studies: Cardiac studies reviewed are outlined and summarized above. Otherwise please see EMR for full report. Cardiac Studies & Procedures   ______________________________________________________________________________________________     ECHOCARDIOGRAM  ECHOCARDIOGRAM COMPLETE 06/13/2021  Narrative ECHOCARDIOGRAM REPORT    Patient Name:   Darlene Saunders Date of Exam: 06/13/2021 Medical Rec #:  [id number]     Height:       68.0 in Accession #:    [id number]    Weight:       137.2 lb Date of Birth:  05/27/1956      BSA:          1.741 m Patient Age:    65 years      BP:           140/78 mmHg Patient Gender: F             HR:           59 bpm. Exam Location:  [address]  Procedure: 2D Echo, 3D Echo, Cardiac Doppler, Color Doppler  and Strain Analysis  Indications:    R01.1 Murmur  History:        Patient has no prior history of Echocardiogram examinations. Risk Factors:Hypertension, Dyslipidemia and Diabetes. AAA. Chronic kidney disease.  Sonographer:    Carl Rodgers-Jones RDCS Referring Phys: [id number] DAYNA N DUNN  IMPRESSIONS   1. Left ventricular ejection fraction, by estimation, is 60 to 65%. The left ventricle has normal function. The left ventricle has no regional wall motion abnormalities. There is moderate asymmetric left ventricular hypertrophy of the basal-septal segment. Left ventricular diastolic parameters are  indeterminate. The average left ventricular global longitudinal strain is -24.3 %. The global longitudinal strain is normal. 2. Right ventricular systolic function is normal. The right ventricular size is normal. There is normal pulmonary artery systolic pressure. 3. Left atrial size was mildly dilated. 4. The mitral valve is normal in structure. Trivial mitral valve regurgitation. No evidence of mitral stenosis. Moderate to severe mitral annular calcification. 5. The aortic valve is tricuspid. There is mild calcification of the aortic valve. There is mild thickening of the aortic valve. Aortic valve regurgitation is trivial. Mild aortic valve sclerosis is present, with no evidence of aortic valve stenosis. 6. The inferior vena cava is normal in size with greater than 50% respiratory variability, suggesting right atrial pressure of 3 mmHg.  Comparison(s): No prior Echocardiogram.  Conclusion(s)/Recommendation(s): Otherwise normal echocardiogram, with minor abnormalities described in the report.  FINDINGS Left Ventricle: Left ventricular ejection fraction, by estimation, is 60 to 65%. The left ventricle has normal function. The left ventricle has no regional wall motion abnormalities. The average left ventricular global longitudinal strain is -24.3 %. The global longitudinal strain is normal. The left ventricular internal cavity size was normal in size. There is moderate asymmetric left ventricular hypertrophy of the basal-septal segment. Left ventricular diastolic parameters are indeterminate.  Right Ventricle: The right ventricular size is normal. No increase in right ventricular wall thickness. Right ventricular systolic function is normal. There is normal pulmonary artery systolic pressure. The tricuspid regurgitant velocity is 2.25 m/s, and with an assumed right atrial pressure of 3 mmHg, the estimated right ventricular systolic pressure is 23.2 mmHg.  Left Atrium: Left atrial size was mildly  dilated.  Right Atrium: Right atrial size was normal in size.  Pericardium: There is no evidence of pericardial effusion.  Mitral Valve: The mitral valve is normal in structure. There is mild thickening of the mitral valve leaflet(s). There is mild calcification of the mitral valve leaflet(s). Moderate to severe mitral annular calcification. Trivial mitral valve regurgitation. No evidence of mitral valve stenosis.  Tricuspid Valve: The tricuspid valve is normal in structure. Tricuspid valve regurgitation is trivial. No evidence of tricuspid stenosis.  Aortic Valve: The aortic valve is tricuspid. There is mild calcification of the aortic valve. There is mild thickening of the aortic valve. Aortic valve regurgitation is trivial. Mild aortic valve sclerosis is present, with no evidence of aortic valve stenosis.  Pulmonic Valve: The pulmonic valve was not well visualized. Pulmonic valve regurgitation is trivial. No evidence of pulmonic stenosis.  Aorta: The aortic root, ascending aorta, aortic arch and descending aorta are all structurally normal, with no evidence of dilitation or obstruction.  Venous: The inferior vena cava is normal in size with greater than 50% respiratory variability, suggesting right atrial pressure of 3 mmHg.  IAS/Shunts: No atrial level shunt detected by color flow Doppler.   LEFT VENTRICLE PLAX 2D LVIDd:         3.70 cm  Diastology LVIDs:         2.50 cm  LV e' medial:    6.64 cm/s LV PW:         1.10 cm  LV E/e' medial:  14.1 LV IVS:        1.40 cm  LV e' lateral:   7.18 cm/s LVOT diam:     1.90 cm  LV E/e' lateral: 13.0 LV SV:         90 LV SV Index:   52       2D Longitudinal Strain LVOT Area:     2.84 cm 2D Strain GLS (A2C):   -24.2 % 2D Strain GLS (A3C):   -24.4 % 2D Strain GLS (A4C):   -24.4 % 2D Strain GLS Avg:     -24.3 %  3D Volume EF: 3D EF:        67 % LV EDV:       142 ml LV ESV:       47 ml LV SV:        94 ml  RIGHT VENTRICLE              IVC RV Basal diam:  3.50 cm     IVC diam: 1.30 cm RV S prime:     16.20 cm/s TAPSE (M-mode): 3.0 cm  LEFT ATRIUM             Index       RIGHT ATRIUM          Index LA diam:        3.90 cm 2.24 cm/m  RA Area:     9.71 cm LA Vol (A2C):   45.3 ml 26.02 ml/m RA Volume:   20.10 ml 11.54 ml/m LA Vol (A4C):   45.2 ml 25.96 ml/m LA Biplane Vol: 46.1 ml 26.47 ml/m AORTIC VALVE LVOT Vmax:   131.00 cm/s LVOT Vmean:  86.600 cm/s LVOT VTI:    0.319 m  AORTA Ao Root diam: 3.30 cm Ao Asc diam:  3.70 cm  MITRAL VALVE               TRICUSPID VALVE MV Area (PHT): 3.42 cm    TR Peak grad:   20.2 mmHg MV Decel Time: 222 msec    TR Vmax:        225.00 cm/s MV E velocity: 93.30 cm/s MV A velocity: 72.30 cm/s  SHUNTS MV E/A ratio:  1.29        Systemic VTI:  0.32 m Systemic Diam: 1.90 cm  Shelda Bruckner MD Electronically signed by Shelda Bruckner MD Signature Date/Time: 06/14/2021/11:45:10 PM    Final          ______________________________________________________________________________________________       Current Reported Medications:.    Current Meds  Medication Sig   acyclovir (ZOVIRAX) 400 MG tablet Take 400 mg by mouth 2 (two) times daily.   ALPRAZolam  (XANAX ) 0.5 MG tablet Take 0.5 mg by mouth at bedtime.    amLODipine  (NORVASC ) 10 MG tablet Take 10 mg by mouth daily.   atenolol  (TENORMIN ) 100 MG tablet Take 100 mg by mouth daily.    Biotin 5000 MCG CAPS Take 1 capsule by mouth daily.   colestipol (COLESTID) 1 g tablet Take 2 g by mouth in the morning, at noon, and at bedtime.   esomeprazole (NEXIUM) 40 MG capsule Take 40 mg by mouth 2 (two) times daily before a meal.   estradiol (ESTRACE) 2 MG tablet Take 2  mg by mouth daily.   fenofibrate  160 MG tablet TAKE 1 TABLET BY MOUTH DAILY   icosapent  Ethyl (VASCEPA ) 1 g capsule Take 2 capsules (2 g total) by mouth 2 (two) times daily.   indapamide  (LOZOL ) 2.5 MG tablet Take 2.5 mg by mouth daily.    levothyroxine  (SYNTHROID , LEVOTHROID) 50 MCG tablet Take 50 mcg by mouth daily before breakfast.    metoCLOPramide (REGLAN) 10 MG tablet Take 10 mg by mouth as needed for nausea (Take one tablet every 4 to 6 hours with migraine headaches as directed for her migrains).   ramipril  (ALTACE ) 10 MG capsule TAKE 1 CAPSULE(10 MG) BY MOUTH TWICE DAILY   REPATHA  SURECLICK 140 MG/ML SOAJ INJECT UNDER THE SKIN EVERY 14 DAYS   sertraline  (ZOLOFT ) 100 MG tablet Take 100 mg by mouth daily.    spironolactone  (ALDACTONE ) 25 MG tablet Take 12.5 mg by mouth daily.   tiZANidine  (ZANAFLEX ) 4 MG tablet Take 4 mg by mouth as needed for muscle spasms.   Physical Exam:    VS:  BP (!) 148/76   Pulse 68   Ht 5' 5.5 (1.664 m)   Wt 119 lb (54 kg)   SpO2 100%   BMI 19.50 kg/m    Wt Readings from Last 3 Encounters:  04/28/24 119 lb (54 kg)  12/09/23 124 lb (56.2 kg)  11/27/23 119 lb (54 kg)    GEN: Well nourished, well developed in no acute distress NECK: No JVD; No carotid bruits CARDIAC: RRR, no murmurs, rubs, gallops RESPIRATORY:  Clear to auscultation without rales, wheezing or rhonchi  ABDOMEN: Soft, non-tender, non-distended EXTREMITIES:  No edema; No acute deformity     Asessement and Plan:.    Abdominal pain: Patient reports increased right lower abdominal pain that has been present for the last month, reports that she has a constant pain described as an ache.  She denies any alleviating or aggravating factors.  She is concerned it is related to her history of mesenteric artery stenosis.  She also notes that she has a history of chronic abdominal pain however feels that this is different, she reports that she does have a history of an appendectomy.  Patient also reports a history of IBS with frequent diarrhea.  Recommended patient be evaluated in the emergency department given ongoing abdominal pain, patient declined emergency room evaluation.  Also recommended evaluation by her gastroenterologist  given her history.  Bilateral lower extremity pain: Patient initially presented today regarding increased bilateral lower extremity pain when walking, she reports that she has increased discomfort in her calfs when she walks, reports this started a few months ago.  She reports a tightening or cramping sensation in her legs, resolves with rest, reports it does not occur when she is at rest.  Patient without any open wounds, she has normal coloring and +1 DP/PT.  Will check ABIs and lower extremity arterial duplex.  Continue Repatha , fenofibrate  and Vascepa .  Hypertension: Blood pressure today 156/84, on recheck was 148/76.  Patient reports that she has not taken her blood pressure medications today, she reports that she will take when she returns home.  Hyperlipidemia: Patient reports that her PCP has been monitoring her lipid profiles, will request from PCPs office. Continue Repatha , fenofibrate  and Vascepa .  Carotid artery disease: Carotid ultrasounds in 06/2023 indicated bilateral ICA 1-39% stenosis. Continue aspirin  and statin.   Tobacco use: Patient reports she currently smokes 4-6 cigarettes daily. Complete cessation encouraged.   Peripheral artery disease: Patient with history  of peripheral artery disease with celiac and mesenteric artery stenosis identified on prior Doppler studies.  She is monitored by vascular surgery. Given abdominal pain, reported weight loss and decreased food intake recommended patient follow up with vascular surgery. Informed by vascular triage nurse that patient should present to emergency room, as noted above, patient continued to refuse. Check mesenteric ultrasound.   Abdominal aortic aneurysm: Patient noted to have small abdominal aortic aneurysm measuring 3.2 cm by Doppler study in December 2024.  She is followed by vascular surgery.   Disposition: F/u with Dr. Jeffrie in 6 months   Signed, Laria Grimmett D Christifer Chapdelaine, NP

## 2024-04-28 ENCOUNTER — Telehealth: Payer: Self-pay

## 2024-04-28 ENCOUNTER — Encounter: Payer: Self-pay | Admitting: Cardiology

## 2024-04-28 ENCOUNTER — Other Ambulatory Visit: Payer: Self-pay

## 2024-04-28 ENCOUNTER — Emergency Department (HOSPITAL_COMMUNITY)
Admission: EM | Admit: 2024-04-28 | Discharge: 2024-04-28 | Disposition: A | Discharge status: Home / Self Care | Source: Ambulatory Visit | Attending: Emergency Medicine | Admitting: Emergency Medicine

## 2024-04-28 ENCOUNTER — Ambulatory Visit: Attending: Cardiology | Admitting: Cardiology

## 2024-04-28 ENCOUNTER — Encounter (HOSPITAL_COMMUNITY): Payer: Self-pay | Admitting: Emergency Medicine

## 2024-04-28 ENCOUNTER — Emergency Department (HOSPITAL_COMMUNITY)

## 2024-04-28 VITALS — BP 148/76 | HR 68 | Ht 65.5 in | Wt 119.0 lb

## 2024-04-28 DIAGNOSIS — I10 Essential (primary) hypertension: Secondary | ICD-10-CM

## 2024-04-28 DIAGNOSIS — I6523 Occlusion and stenosis of bilateral carotid arteries: Secondary | ICD-10-CM

## 2024-04-28 DIAGNOSIS — Z720 Tobacco use: Secondary | ICD-10-CM

## 2024-04-28 DIAGNOSIS — M79651 Pain in right thigh: Secondary | ICD-10-CM | POA: Diagnosis not present

## 2024-04-28 DIAGNOSIS — E782 Mixed hyperlipidemia: Secondary | ICD-10-CM | POA: Diagnosis not present

## 2024-04-28 DIAGNOSIS — E1122 Type 2 diabetes mellitus with diabetic chronic kidney disease: Secondary | ICD-10-CM | POA: Diagnosis not present

## 2024-04-28 DIAGNOSIS — Z79899 Other long term (current) drug therapy: Secondary | ICD-10-CM | POA: Insufficient documentation

## 2024-04-28 DIAGNOSIS — I129 Hypertensive chronic kidney disease with stage 1 through stage 4 chronic kidney disease, or unspecified chronic kidney disease: Secondary | ICD-10-CM | POA: Insufficient documentation

## 2024-04-28 DIAGNOSIS — R1031 Right lower quadrant pain: Secondary | ICD-10-CM | POA: Insufficient documentation

## 2024-04-28 DIAGNOSIS — E871 Hypo-osmolality and hyponatremia: Secondary | ICD-10-CM | POA: Insufficient documentation

## 2024-04-28 DIAGNOSIS — M79652 Pain in left thigh: Secondary | ICD-10-CM | POA: Insufficient documentation

## 2024-04-28 DIAGNOSIS — I739 Peripheral vascular disease, unspecified: Secondary | ICD-10-CM

## 2024-04-28 DIAGNOSIS — K551 Chronic vascular disorders of intestine: Secondary | ICD-10-CM

## 2024-04-28 DIAGNOSIS — H34212 Partial retinal artery occlusion, left eye: Secondary | ICD-10-CM

## 2024-04-28 DIAGNOSIS — N189 Chronic kidney disease, unspecified: Secondary | ICD-10-CM | POA: Insufficient documentation

## 2024-04-28 DIAGNOSIS — I6529 Occlusion and stenosis of unspecified carotid artery: Secondary | ICD-10-CM

## 2024-04-28 DIAGNOSIS — I7143 Infrarenal abdominal aortic aneurysm, without rupture: Secondary | ICD-10-CM

## 2024-04-28 DIAGNOSIS — D649 Anemia, unspecified: Secondary | ICD-10-CM | POA: Diagnosis not present

## 2024-04-28 LAB — URINALYSIS, ROUTINE W REFLEX MICROSCOPIC
Bilirubin Urine: NEGATIVE
Glucose, UA: NEGATIVE mg/dL
Hgb urine dipstick: NEGATIVE
Ketones, ur: NEGATIVE mg/dL
Leukocytes,Ua: NEGATIVE
Nitrite: NEGATIVE
Protein, ur: NEGATIVE mg/dL
Specific Gravity, Urine: 1.009 (ref 1.005–1.030)
pH: 6 (ref 5.0–8.0)

## 2024-04-28 LAB — CBC
HCT: 34.3 % — ABNORMAL LOW (ref 36.0–46.0)
Hemoglobin: 11.2 g/dL — ABNORMAL LOW (ref 12.0–15.0)
MCH: 34.9 pg — ABNORMAL HIGH (ref 26.0–34.0)
MCHC: 32.7 g/dL (ref 30.0–36.0)
MCV: 106.9 fL — ABNORMAL HIGH (ref 80.0–100.0)
Platelets: 237 K/uL (ref 150–400)
RBC: 3.21 MIL/uL — ABNORMAL LOW (ref 3.87–5.11)
RDW: 12.6 % (ref 11.5–15.5)
WBC: 5.5 K/uL (ref 4.0–10.5)
nRBC: 0 % (ref 0.0–0.2)

## 2024-04-28 LAB — COMPREHENSIVE METABOLIC PANEL WITH GFR
ALT: 17 U/L (ref 0–44)
AST: 31 U/L (ref 15–41)
Albumin: 4.5 g/dL (ref 3.5–5.0)
Alkaline Phosphatase: 20 U/L — ABNORMAL LOW (ref 38–126)
Anion gap: 12 (ref 5–15)
BUN: 26 mg/dL — ABNORMAL HIGH (ref 8–23)
CO2: 21 mmol/L — ABNORMAL LOW (ref 22–32)
Calcium: 9.7 mg/dL (ref 8.9–10.3)
Chloride: 95 mmol/L — ABNORMAL LOW (ref 98–111)
Creatinine, Ser: 1.08 mg/dL — ABNORMAL HIGH (ref 0.44–1.00)
GFR, Estimated: 56 mL/min — ABNORMAL LOW (ref >60)
Glucose, Bld: 71 mg/dL (ref 70–99)
Potassium: 4 mmol/L (ref 3.5–5.1)
Sodium: 128 mmol/L — ABNORMAL LOW (ref 135–145)
Total Bilirubin: 0.9 mg/dL (ref 0.0–1.2)
Total Protein: 7.9 g/dL (ref 6.5–8.1)

## 2024-04-28 LAB — LIPASE, BLOOD: Lipase: 49 U/L (ref 11–51)

## 2024-04-28 MED ORDER — IOHEXOL 350 MG/ML SOLN
100.0000 mL | Freq: Once | INTRAVENOUS | Status: AC | PRN
  Administered 2024-04-28: 100 mL via INTRAVENOUS

## 2024-04-28 MED ORDER — ALPRAZOLAM 0.5 MG PO TABS
0.5000 mg | ORAL_TABLET | Freq: Once | ORAL | Status: AC
  Administered 2024-04-28: 0.5 mg via ORAL
  Filled 2024-04-28: qty 1

## 2024-04-28 NOTE — Telephone Encounter (Signed)
 Pt was sent downstairs to [facility] after her cardiology appt where she complained of abdominal pain.  Because of her history of mesenteric artery aneurysm and AAA, cardiology provider wanted [facility] to see her. [facility] Triage RN advised pt to go to the ED but she refused. Pt walked back upstairs to her Cardiology provider who was contacted about the situation and she decided to order US  for the patient.

## 2024-04-28 NOTE — ED Provider Notes (Signed)
 I provided a substantive portion of the care of this patient.  I personally made/approved the management plan for this patient and take responsibility for the patient management.  EKG Interpretation Date/Time:  Thursday April 28 2024 14:52:12 EDT Ventricular Rate:  54 PR Interval:  180 QRS Duration:  90 QT Interval:  438 QTC Calculation: 416 R Axis:   12  Text Interpretation: Sinus rhythm Consider left atrial enlargement Confirmed by Dasie Faden ([id number]) on 04/28/2024 5:06:26 PM   Patient is EKG is normal sinus rhythm.  Patient here with complaint of 2 weeks of right lower quadrant abdominal pain.  Has a known history of abdominal aortic aneurysm.  Abdominal exam is without evidence of peritonitis.  Will obtain abdominal CT with vascular runoff of right lower extremity   Dasie Faden, MD 04/28/24 1730

## 2024-04-28 NOTE — ED Provider Notes (Signed)
 [place] EMERGENCY DEPARTMENT AT [facility] Provider Note   CSN: [id number] Arrival date & time: 04/28/24  1444     Patient presents with: Abdominal Pain   Darlene Saunders is a 68 y.o. female past medical history significant for AAA, diabetes, hypertension, PAD, CKD and appendectomy presents today for right lower abdomen and bilateral thigh pain.  Patient states that her pain in her thighs is worse with movement and better with rest.  Patient denies chest pain, shortness of breath, nausea, vomiting, fever, chills, numbness, weakness, dizziness, or blood thinner use.    Abdominal Pain      Prior to Admission medications   Medication Sig Start Date End Date Taking? Authorizing Provider  acyclovir (ZOVIRAX) 400 MG tablet Take 400 mg by mouth 2 (two) times daily. 09/20/19   [provider]  ALPRAZolam  (XANAX ) 0.5 MG tablet Take 0.5 mg by mouth at bedtime.  06/09/15   [provider]  amLODipine  (NORVASC ) 10 MG tablet Take 10 mg by mouth daily.    [provider]  atenolol  (TENORMIN ) 100 MG tablet Take 100 mg by mouth daily.  12/11/13   [provider]  Biotin 5000 MCG CAPS Take 1 capsule by mouth daily.    [provider]  colestipol (COLESTID) 1 g tablet Take 2 g by mouth in the morning, at noon, and at bedtime. 06/15/21   [provider]  dicyclomine (BENTYL) 20 MG tablet Take 1-2 tablets by mouth daily as needed for spasms. Patient not taking: Reported on 04/28/2024 04/14/22   [provider]  esomeprazole (NEXIUM) 40 MG capsule Take 40 mg by mouth 2 (two) times daily before a meal.    [provider]  estradiol (ESTRACE) 2 MG tablet Take 2 mg by mouth daily. 12/28/19   [provider]  fenofibrate  160 MG tablet TAKE 1 TABLET BY MOUTH DAILY 12/25/23   Jeffrie Oneil BROCKS, MD  icosapent  Ethyl (VASCEPA ) 1 g capsule Take 2 capsules (2 g total) by mouth 2 (two) times daily. 12/09/23   Lelon Hamilton T, PA-C   indapamide  (LOZOL ) 2.5 MG tablet Take 2.5 mg by mouth daily.    [provider]  levothyroxine  (SYNTHROID , LEVOTHROID) 50 MCG tablet Take 50 mcg by mouth daily before breakfast.  12/11/13   [provider]  metoCLOPramide (REGLAN) 10 MG tablet Take 10 mg by mouth as needed for nausea (Take one tablet every 4 to 6 hours with migraine headaches as directed for her migrains).    [provider]  predniSONE (DELTASONE) 20 MG tablet Take 20 mg by mouth as directed. Patient not taking: Reported on 04/28/2024 10/30/23   [provider]  ramipril  (ALTACE ) 10 MG capsule TAKE 1 CAPSULE(10 MG) BY MOUTH TWICE DAILY 07/08/22   Lelon Hamilton T, PA-C  REPATHA  SURECLICK 140 MG/ML SOAJ INJECT UNDER THE SKIN EVERY 14 DAYS 06/30/23   Lelon Hamilton T, PA-C  sertraline  (ZOLOFT ) 100 MG tablet Take 100 mg by mouth daily.  12/11/13   [provider]  spironolactone  (ALDACTONE ) 25 MG tablet Take 12.5 mg by mouth daily.    [provider]  tiZANidine  (ZANAFLEX ) 4 MG tablet Take 4 mg by mouth as needed for muscle spasms. 06/12/21   [provider]    Allergies: Dexilant [dexlansoprazole], Crestor  [rosuvastatin ], Hctz [hydrochlorothiazide], Levofloxacin, Lipitor [atorvastatin], Livalo  [pitavastatin ], Penicillins, Pravastatin , Sulfamethoxazole-trimethoprim, Augmentin [amoxicillin-pot clavulanate], and Cipro hc [ciprofloxacin-hydrocortisone]    Review of Systems  Gastrointestinal:  Positive for abdominal pain.  Musculoskeletal:  Positive for myalgias.    Updated Vital Signs BP (!) 155/73 (BP Location: Right Arm)   Pulse (!) 55   Temp 97.7 F (36.5 C) (Oral)   Resp 15   SpO2 100%   Physical Exam Vitals and nursing note reviewed.  Constitutional:      General: She is not in acute distress.    Appearance: She is well-developed. She is not ill-appearing.  HENT:     Head: Normocephalic and atraumatic.  Eyes:     Extraocular Movements: Extraocular movements  intact.     Conjunctiva/sclera: Conjunctivae normal.  Cardiovascular:     Rate and Rhythm: Normal rate and regular rhythm.     Heart sounds: Normal heart sounds. No murmur heard. Pulmonary:     Effort: Pulmonary effort is normal. No respiratory distress.     Breath sounds: Normal breath sounds.  Abdominal:     General: Abdomen is flat. There is no distension.     Palpations: Abdomen is soft.     Tenderness: There is abdominal tenderness in the right lower quadrant. There is guarding. Negative signs include Murphy's sign, Rovsing's sign and McBurney's sign.  Musculoskeletal:        General: No swelling.     Cervical back: Neck supple.     Comments: Patient with equal bilateral grip strength, equal bilateral plantar and dorsiflexion of bilateral lower extremities, equal bilateral hip extension and flexion against resistance.  +2 bilateral dorsalis pedis pulses.  Neurovascularly intact.  Mild tenderness to palpation of the bilateral thighs.  Lower extremity compartments soft.  Skin:    General: Skin is warm and dry.     Capillary Refill: Capillary refill takes less than 2 seconds.  Neurological:     General: No focal deficit present.     Mental Status: She is alert and oriented to person, place, and time.     Cranial Nerves: No cranial nerve deficit.     Motor: No weakness.  Psychiatric:        Mood and Affect: Mood normal.     (all labs ordered are listed, but only abnormal results are displayed) Labs Reviewed  COMPREHENSIVE METABOLIC PANEL WITH GFR - Abnormal; Notable for the following components:      Result Value   Sodium 128 (*)    Chloride 95 (*)    CO2 21 (*)    BUN 26 (*)    Creatinine, Ser 1.08 (*)    Alkaline Phosphatase 20 (*)    GFR, Estimated 56 (*)    All other components within normal limits  CBC - Abnormal; Notable for the following components:   RBC 3.21 (*)    Hemoglobin 11.2 (*)    HCT 34.3 (*)    MCV 106.9 (*)    MCH 34.9 (*)    All other components  within normal limits  URINALYSIS, ROUTINE W REFLEX MICROSCOPIC - Abnormal; Notable for the following components:   Color, Urine STRAW (*)    All other components within normal limits  LIPASE, BLOOD    EKG: EKG Interpretation Date/Time:  Thursday April 28 2024 14:52:12 EDT Ventricular Rate:  54 PR Interval:  180 QRS Duration:  90 QT Interval:  438 QTC Calculation: 416 R Axis:   12  Text Interpretation: Sinus rhythm Consider left atrial enlargement Confirmed by Dasie Faden ([id number]) on 04/28/2024 5:06:26 PM  Radiology: CT Angio Aortobifemoral W and/or Wo Contrast Result Date: 04/28/2024 CLINICAL DATA:  Worsening right lower quadrant and bilateral thigh pain,  history of aortic aneurysm EXAM: CT ANGIOGRAPHY OF ABDOMINAL AORTA WITH ILIOFEMORAL RUNOFF TECHNIQUE: Multidetector CT imaging of the abdomen, pelvis and lower extremities was performed using the standard protocol during bolus administration of intravenous contrast. Multiplanar CT image reconstructions and MIPs were obtained to evaluate the vascular anatomy. RADIATION DOSE REDUCTION: This exam was performed according to the departmental dose-optimization program which includes automated exposure control, adjustment of the mA and/or kV according to patient size and/or use of iterative reconstruction technique. CONTRAST:  OMNIPAQUE  IOHEXOL  350 MG/ML SOLN COMPARISON:  07/18/2021 FINDINGS: VASCULAR Aorta: Infrarenal abdominal aortic aneurysm is again noted, measuring 3.2 x 3.1 cm, not appreciably changed since prior study. There is diffuse atherosclerosis throughout the thoracoabdominal aorta, similar to prior study. No evidence of aortic dissection. No critical stenosis or evidence of vasculitis. Celiac: Patent without evidence of aneurysm, dissection, vasculitis or significant stenosis. SMA: Patent without evidence of aneurysm, dissection, vasculitis or significant stenosis. Diffuse atherosclerosis. Renals: Both renal arteries are patent  without evidence of aneurysm, dissection, vasculitis, fibromuscular dysplasia or significant stenosis. Mild atherosclerosis at the origin of the bilateral renal arteries. IMA: Patent without evidence of aneurysm, dissection, vasculitis or significant stenosis. RIGHT Lower Extremity Inflow: Common, internal and external iliac arteries are patent without evidence of aneurysm, dissection, vasculitis or significant stenosis. Mild atherosclerosis. Outflow: Common, superficial and profunda femoral arteries and the popliteal artery are patent without evidence of aneurysm, dissection, vasculitis or significant stenosis. Runoff: Patent three vessel runoff to the ankle. LEFT Lower Extremity Inflow: Common, internal and external iliac arteries are patent without evidence of dissection, vasculitis, or significant stenosis. There is a 0.9 cm left internal iliac artery aneurysm, previous having measured 0.8 cm. Outflow: Common, superficial and profunda femoral arteries and the popliteal artery are patent without evidence of aneurysm, dissection, vasculitis or significant stenosis. Runoff: Patent three vessel runoff to the ankle. Veins: On delayed imaging through the lower extremities there is evidence of nonocclusive thrombus within the left calf veins, reference image 364/37. Remaining venous structures are unremarkable. Further evaluation with Doppler ultrasound may be useful. Review of the MIP images confirms the above findings. NON-VASCULAR Lower chest: No acute pleural or parenchymal lung disease. Hepatobiliary: Hepatic steatosis. No focal liver abnormality. The gallbladder is unremarkable. Pancreas: Unremarkable. No pancreatic ductal dilatation or surrounding inflammatory changes. Spleen: Normal in size without focal abnormality. Adrenals/Urinary Tract: The adrenals are unremarkable. Numerous bilateral renal cortical cysts do not require specific imaging follow-up. No urinary tract calculi or obstructive uropathy within  either kidney. The bladder is unremarkable. Stomach/Bowel: No bowel obstruction or ileus. The appendix is surgically absent. No bowel wall thickening or inflammatory change. Lymphatic: No pathologic adenopathy within the abdomen or pelvis. Reproductive: Status post hysterectomy. No adnexal masses. Other: No free fluid or free intraperitoneal gas. No abdominal wall hernia. Musculoskeletal: No acute or destructive bony abnormalities. Reconstructed images demonstrate no additional findings. IMPRESSION: VASCULAR 1. Abdominal aortic aneurysm measuring 3.2 cm. Recommend surveillance ultrasound in 3 years. Reference: Journal of Vascular Surgery 67.1 (2018): 2-77. J Am Coll Radiol 2013;10:789-794. 2. No evidence of aortic dissection. 3. Nonocclusive thrombus within a left calf vein. No CT evidence of DVT. Correlation with Doppler ultrasound may be useful. 4. 0.9 cm left internal iliac artery aneurysm, not appreciably changed since prior study where this had measured 0.8 cm. 5. Aortic Atherosclerosis (ICD10-I70.0). No significant stenosis. Normal bilateral lower extremity runoff. NON-VASCULAR 1. No acute intra-abdominal or intrapelvic process. 2. Hepatic steatosis. Electronically Signed   By: Ozell Delores HERO.D.  On: 04/28/2024 21:34     Procedures   Medications Ordered in the ED  ALPRAZolam  (XANAX ) tablet 0.5 mg (0.5 mg Oral Given 04/28/24 1600)  iohexol  (OMNIPAQUE ) 350 MG/ML injection 100 mL (100 mLs Intravenous Contrast Given 04/28/24 2051)                                    Medical Decision Making Amount and/or Complexity of Data Reviewed Labs: ordered. Radiology: ordered.  Risk Prescription drug management.   This patient presents to the ED for concern of right lower quadrant abdominal and bilateral thigh pain differential diagnosis includes claudication, ischemia, anxiety, colitis    Additional history obtained   Additional history obtained from Electronic Medical Record External records  from outside source obtained and reviewed including cardiology notes   Lab Tests:  I Ordered, and personally interpreted labs.  The pertinent results include: Anemia at 11.2 which is chronic per historical values, hyponatremia 128, reduced chloride at 95, reduced CO2 of 21, BUN at 26, mildly elevated creatinine at 1.08, reduced alk phos at 20, urine unremarkable   Imaging Studies ordered:  I ordered imaging studies including CT abdomen pelvis with contrast I independently visualized and interpreted imaging which showed abdominal aortic aneurysm measuring 3.2 cm, recommended surveillance ultrasound in 3 years.  No evidence of aortic dissection.  Nonocclusive thrombus within the left calf vein, no CT evidence of DVT.  0.9 cm left internal iliac artery aneurysm, not appreciably changed since prior study.  No acute intra-abdominal or intrapelvic process.  Hepatic steatosis. I agree with the radiologist interpretation EKG which showed sinus rhythm   Medicines ordered and prescription drug management:  I ordered medication including Xanax     I have reviewed the patients home medicines and have made adjustments as needed   Problem List / ED Course:  Considered for admission or further workup however patient's vital signs, physical exam, labs, and imaging are reassuring.  Patient scheduled for outpatient DVT ultrasound as vascular ultrasounds not currently available.  Patient also to follow-up with her PCP regarding her mild hyponatremia while in the ED as well as her vascular surgery provider for further evaluation and workup if her symptoms persist.  Patient given return precautions.  I feel patient safe for discharge at this time.         Final diagnoses:  Bilateral thigh pain  Hyponatremia    ED Discharge Orders          Ordered    LE Venous       Comments: IMPORTANT PATIENT INSTRUCTIONS: You have been scheduled for an Outpatient Vascular Study at [facility].  If tomorrow is a  Saturday, Sunday or holiday, please go to the [facility] Emergency Department Registration Desk at 11 am tomorrow morning and tell them you are there for a vascular study.  If tomorrow is a weekday (Monday-Friday), please go to the [facility] (address [address], [place]) at 8 am and report to the 4th floor registration Zone A.  Inform registration that you are there for a vascular study.   04/28/24 2146               Francis Ileana SAILOR, PA-C 04/28/24 2147    Dasie Faden, MD 04/28/24 2203

## 2024-04-28 NOTE — ED Triage Notes (Signed)
 Patient has a history of aortic aneurysm. She now complains of worsening pain in the lower right quadrant and bilateral thigh pain. Her cardiologist sent her here.

## 2024-04-28 NOTE — Patient Instructions (Addendum)
 Medication Instructions:  No changes *If you need a refill on your cardiac medications before your next appointment, please call your pharmacy*  Lab Work: No labs  Testing/Procedures: Your physician has requested that you have an ankle brachial index (ABI). During this test an ultrasound and blood pressure cuff are used to evaluate the arteries that supply the arms and legs with blood. Allow thirty minutes for this exam. There are no restrictions or special instructions. This will take place at [address] 4th floor  Please note: We ask at that you not bring children with you during ultrasound (echo/ vascular) testing. Due to room size and safety concerns, children are not allowed in the ultrasound rooms during exams. Our front office staff cannot provide observation of children in our lobby area while testing is being conducted. An adult accompanying a patient to their appointment will only be allowed in the ultrasound room at the discretion of the ultrasound technician under special circumstances. We apologize for any inconvenience.  Your physician has requested that you have a lower extremity arterial duplex. During this test, ultrasound is used to evaluate arterial blood flow in the legs. Allow one hour for this exam. There are no restrictions or special instructions. This will take place at [address], 4th floor  Please note: We ask at that you not bring children with you during ultrasound (echo/ vascular) testing. Due to room size and safety concerns, children are not allowed in the ultrasound rooms during exams. Our front office staff cannot provide observation of children in our lobby area while testing is being conducted. An adult accompanying a patient to their appointment will only be allowed in the ultrasound room at the discretion of the ultrasound technician under special circumstances. We apologize for any inconvenience.  Follow-Up: At [facility], you and your  health needs are our priority.  As part of our continuing mission to provide you with exceptional heart care, our providers are all part of one team.  This team includes your primary Cardiologist (physician) and Advanced Practice Providers or APPs (Physician Assistants and Nurse Practitioners) who all work together to provide you with the care you need, when you need it.  Your next appointment:   4-5 month(s)  Provider:   Oneil Parchment, MD    Other Appointments: You can stop on the 4th floor to make a f/u with the Vascular team  We recommend signing up for the patient portal called MyChart.  Sign up information is provided on this After Visit Summary.  MyChart is used to connect with patients for Virtual Visits (Telemedicine).  Patients are able to view lab/test results, encounter notes, upcoming appointments, etc.  Non-urgent messages can be sent to your provider as well.   To learn more about what you can do with MyChart, go to [email or web address].

## 2024-04-28 NOTE — Discharge Instructions (Addendum)
 Today you were seen for bilateral leg pain and right lower quadrant pain.  Your aneurysm appears stable on CT.  Your CT did show a nonocclusive thrombus in your left calf, you have been scheduled for an outpatient vascular ultrasound.  You were also found to have mildly low sodium.  Please follow-up with your PCP to ensure that this resolves.  Please also follow-up with your vascular surgery provider if your symptoms persist for further evaluation and workup.  Please return to the ED if you have sudden shortness of breath, color change in your legs, or worsening pain.  Thank you for letting us  treat you today. After reviewing your labs and imaging, I feel you are safe to go home. Please follow up with your PCP in the next several days and provide them with your records from this visit. Return to the Emergency Room if pain becomes severe or symptoms worsen.

## 2024-04-29 ENCOUNTER — Ambulatory Visit (HOSPITAL_COMMUNITY)

## 2024-04-29 ENCOUNTER — Telehealth (HOSPITAL_COMMUNITY): Payer: Self-pay

## 2024-04-29 NOTE — Telephone Encounter (Signed)
 Attempted to contact the patient to schedule VAS US .  No answer.  Left message.  First Attempt. Provided  direct contact number for scheduling: [phone].   Before able to make phone note Lilu returned my call.  Discussed scheduling stat ultrasound requested from the ED.   While scheduling I noted there was an additional VAS US  study order by [facility] provider Katlyn West. Patient questioned if the imaging done during hospital visit would suffice for information for the additional ultrasound study. A message was sent to the provider, and nurse. I informed the patient that I would attempt to follow-up when information is received.   Provider response: Will review scans mentioned, and follow up.

## 2024-04-29 NOTE — Telephone Encounter (Signed)
 Katlyn West NP reviewed testing for patient that was done in hospital. Katlyn found that patient does not need the mesenteric ultrasound. The CT scan in the hospital showed no significant mesenteric stenosis

## 2024-05-02 ENCOUNTER — Ambulatory Visit (HOSPITAL_COMMUNITY)
Admission: RE | Admit: 2024-05-02 | Discharge: 2024-05-02 | Disposition: A | Discharge status: Home / Self Care | Source: Ambulatory Visit | Attending: Emergency Medicine | Admitting: Emergency Medicine

## 2024-05-02 DIAGNOSIS — M79662 Pain in left lower leg: Secondary | ICD-10-CM | POA: Insufficient documentation

## 2024-05-02 DIAGNOSIS — M7989 Other specified soft tissue disorders: Secondary | ICD-10-CM

## 2024-05-06 ENCOUNTER — Ambulatory Visit (HOSPITAL_BASED_OUTPATIENT_CLINIC_OR_DEPARTMENT_OTHER)
Admission: RE | Admit: 2024-05-06 | Discharge: 2024-05-06 | Disposition: A | Discharge status: Home / Self Care | Source: Ambulatory Visit | Attending: Cardiology | Admitting: Cardiology

## 2024-05-06 ENCOUNTER — Ambulatory Visit (HOSPITAL_COMMUNITY)
Admission: RE | Admit: 2024-05-06 | Discharge: 2024-05-06 | Disposition: A | Discharge status: Home / Self Care | Source: Ambulatory Visit | Attending: Cardiology | Admitting: Cardiology

## 2024-05-06 DIAGNOSIS — I739 Peripheral vascular disease, unspecified: Secondary | ICD-10-CM | POA: Diagnosis present

## 2024-05-06 DIAGNOSIS — I6523 Occlusion and stenosis of bilateral carotid arteries: Secondary | ICD-10-CM | POA: Insufficient documentation

## 2024-05-06 LAB — VAS US ABI WITH/WO TBI
Left ABI: 1.07
Right ABI: 1.05

## 2024-05-10 ENCOUNTER — Telehealth: Payer: Self-pay | Admitting: Cardiology

## 2024-05-10 ENCOUNTER — Ambulatory Visit: Payer: Self-pay | Admitting: Cardiology

## 2024-05-10 NOTE — Telephone Encounter (Signed)
 Pt calling in regards to results.

## 2024-05-10 NOTE — Telephone Encounter (Signed)
-----   Message from Katlyn D West sent at 05/10/2024  4:29 PM EDT ----- Please let Ms. Wax know that her ABIs and lower extremity duplex indicates evidence of plaque buildup in the arteries of the legs however she currently has good blood flow.  Recommend that she  continue Repatha  for good cholesterol control and work toward smoking cessation.  Follow-up with Dr. Jeffrie as scheduled. ----- Message ----- From: Interface, [employer] Sent: 05/06/2024   2:35 PM EDT To: Katlyn D West, NP

## 2024-05-10 NOTE — Telephone Encounter (Signed)
 Called patient advised of below they verbalized understanding.

## 2024-05-10 NOTE — Telephone Encounter (Signed)
 Spoke  to patient -  informed patient results have not been reviewed. Will  inform the provider. Patient verbalized understanding.  Patient preferred   a phone call with the results

## 2024-05-24 ENCOUNTER — Other Ambulatory Visit: Payer: Self-pay | Admitting: Family Medicine

## 2024-05-24 DIAGNOSIS — R1031 Right lower quadrant pain: Secondary | ICD-10-CM

## 2024-05-26 ENCOUNTER — Inpatient Hospital Stay
Admission: RE | Admit: 2024-05-26 | Discharge: 2024-05-26 | Discharge status: Home / Self Care | Source: Ambulatory Visit | Attending: Family Medicine | Admitting: Family Medicine

## 2024-05-26 DIAGNOSIS — R1031 Right lower quadrant pain: Secondary | ICD-10-CM

## 2024-06-04 ENCOUNTER — Other Ambulatory Visit: Payer: Self-pay | Admitting: Physician Assistant

## 2024-06-06 ENCOUNTER — Other Ambulatory Visit: Payer: Self-pay | Admitting: Family Medicine

## 2024-06-06 DIAGNOSIS — R1031 Right lower quadrant pain: Secondary | ICD-10-CM

## 2024-06-06 DIAGNOSIS — M79652 Pain in left thigh: Secondary | ICD-10-CM

## 2024-06-06 DIAGNOSIS — M79651 Pain in right thigh: Secondary | ICD-10-CM

## 2024-06-06 DIAGNOSIS — R29898 Other symptoms and signs involving the musculoskeletal system: Secondary | ICD-10-CM

## 2024-06-07 ENCOUNTER — Ambulatory Visit
Admission: RE | Admit: 2024-06-07 | Discharge: 2024-06-07 | Disposition: A | Discharge status: Home / Self Care | Source: Ambulatory Visit | Attending: Family Medicine | Admitting: Family Medicine

## 2024-06-07 DIAGNOSIS — M79652 Pain in left thigh: Secondary | ICD-10-CM

## 2024-06-07 DIAGNOSIS — M79651 Pain in right thigh: Secondary | ICD-10-CM

## 2024-06-07 DIAGNOSIS — R1031 Right lower quadrant pain: Secondary | ICD-10-CM

## 2024-06-07 DIAGNOSIS — R29898 Other symptoms and signs involving the musculoskeletal system: Secondary | ICD-10-CM

## 2024-08-04 ENCOUNTER — Telehealth: Payer: Self-pay | Admitting: Neurology

## 2024-08-04 NOTE — Telephone Encounter (Signed)
 Appointment details confirmed

## 2024-08-08 ENCOUNTER — Ambulatory Visit (INDEPENDENT_AMBULATORY_CARE_PROVIDER_SITE_OTHER): Admitting: Neurology

## 2024-08-08 ENCOUNTER — Encounter: Payer: Self-pay | Admitting: Neurology

## 2024-08-08 VITALS — BP 173/82 | HR 58 | Resp 16 | Ht 66.0 in | Wt 118.5 lb

## 2024-08-08 DIAGNOSIS — R292 Abnormal reflex: Secondary | ICD-10-CM

## 2024-08-08 DIAGNOSIS — M79604 Pain in right leg: Secondary | ICD-10-CM | POA: Diagnosis not present

## 2024-08-08 DIAGNOSIS — M79605 Pain in left leg: Secondary | ICD-10-CM | POA: Diagnosis not present

## 2024-08-08 DIAGNOSIS — R269 Unspecified abnormalities of gait and mobility: Secondary | ICD-10-CM | POA: Diagnosis not present

## 2024-08-08 NOTE — Progress Notes (Unsigned)
 Chief Complaint  Patient presents with   New Patient (Initial Visit)    Rm14, alone, New patient referral from [facility] @ [place] Sharps MD [phone] for migraines (3/30 days, triggers: stress) & bilateral leg weakness      ASSESSMENT AND PLAN  Darlene Saunders is a 68 y.o. female   Frequent headaches, neck pain, shoulder tension,  MRI of cervical spine to rule out cervical pathology  Referred to physical therapy  Subjective weakness, acute onset lower extremity pain, lower abdominal pain, extensive imaging evaluation failed to demonstrate etiology, laboratory evaluations  DIAGNOSTIC DATA (LABS, IMAGING, TESTING) - I reviewed patient records, labs, notes, testing and imaging myself where available.   MEDICAL HISTORY:  Darlene Saunders, is a 68 year old female seen in request by her primary care from [facility] Dr. Sharps, [place], for evaluation of chronic migraine   History is obtained from the patient and review of electronic medical records. I personally reviewed pertinent available imaging films in PACS.   PMHx of  HTN Depression, anxiety Hypothyroidism HLD Varicella virus infection of both eye Smoke,  She had a stressful few months, mother-in-law died in May 07, 20251 day in 01/27/24 while she was walking down the hall, all of the sudden, she had severe lower extremity and pelvic abdominal pain, subjective weakness, she is able to continue to bear weight, symptoms fluctuate over the past few months, but remain mildly symptomatic  She had extensive evaluations, labs  in August 2025: Hemoglobin 11.2, CMP with mild elevation of creatinine 1.08, mild decrease GFR of 56, better than her baseline 45, normal lipase magnesium ,  MRI of Lumbar Sept 16th 2025. 1. No acute findings or explanation for the patient's symptoms. 2. Mild disc bulging and facet hypertrophy as described. No significant spinal stenosis or nerve root impingement.   CT abdomen and Pelvic on Sept 4 2025   Stranding around the right kidney. No visible stones or hydronephrosis. This is nonspecific but could reflect pyelonephritis. Recommend clinical correlation.   Punctate left nephrolithiasis.  No hydronephrosis.   3.4 cm mid abdominal aortic aneurysm, not significantly changed.  CTA of abdomen in August 2025:  Abdominal aortic aneurysm measuring 3.2 cm.   She had a history of chronic migraine, intermittent occurrence, currently about 3 times each month, retro-orbital area pounding headache with light, noise sensitivity, neck pain, radiating tension to bilateral shoulder,   PHYSICAL EXAM:   Vitals:   08/08/24 1058 08/08/24 1104  BP: (!) 155/71 (!) 173/82  Pulse: (!) 56 (!) 58  Resp: 16   SpO2: 98%   Weight:  118 lb 8 oz (53.8 kg)  Height:  5' 6 (1.676 m)    Body mass index is 19.13 kg/m.  PHYSICAL EXAMNIATION:  Gen: NAD, conversant, well nourised, well groomed                     Cardiovascular: Regular rate rhythm, no peripheral edema, warm, nontender. Eyes: Conjunctivae clear without exudates or hemorrhage Neck: Supple, no carotid bruits. Pulmonary: Clear to auscultation bilaterally   NEUROLOGICAL EXAM:  MENTAL STATUS: Speech/cognition: Awake, alert, oriented to history taking and casual conversation CRANIAL NERVES: CN II: Visual fields are full to confrontation. Pupils are round equal and briskly reactive to light. CN III, IV, VI: extraocular movement are normal. No ptosis. CN V: Facial sensation is intact to light touch CN VII: Face is symmetric with normal eye closure  CN VIII: Hearing is normal to causal conversation. CN IX, X: Phonation is normal.  CN XI: Head turning and shoulder shrug are intact  MOTOR: There is no pronator drift of out-stretched arms. Muscle bulk and tone are normal. Muscle strength is normal.  REFLEXES: Reflexes are 2+ and symmetric at the biceps, triceps, knees, and ankles. Plantar responses are flexor.  SENSORY: Intact to light  touch, pinprick and vibratory sensation are intact in fingers and toes.  COORDINATION: There is no trunk or limb dysmetria noted.  GAIT/STANCE: Push-up, cautious  REVIEW OF SYSTEMS:  Full 14 system review of systems performed and notable only for as above All other review of systems were negative.   ALLERGIES: Allergies  Allergen Reactions   Dexlansoprazole Diarrhea    Other Reaction(s): severe diarrhea   Crestor  [Rosuvastatin ]     Muscle aches on 5mg  3x/week   Hydrochlorothiazide Other (See Comments)    Rash   Levofloxacin Other (See Comments)    Other reaction(s): myalgias  Other Reaction(s): myalgias  Joint pain    Other reaction(s): myalgias   Lipitor [Atorvastatin]     Muscle aches   Livalo  [Pitavastatin ]     Myalgias on 2-4mg  daily   Pravastatin      Muscle aches on 40mg  daily   Amoxicillin-Pot Clavulanate Hives and Rash   Ciprofloxacin-Hydrocortisone Itching and Other (See Comments)    Topical reaction  Other Reaction(s): topical reaction   Penicillins Dermatitis and Hives    Hives, rash   Sulfa Antibiotics Dermatitis and Hives    Hives and rash   Sulfamethoxazole-Trimethoprim Hives and Rash    HOME MEDICATIONS: Current Outpatient Medications  Medication Sig Dispense Refill   acyclovir (ZOVIRAX) 400 MG tablet Take 400 mg by mouth 2 (two) times daily.     ALPRAZolam  (XANAX ) 0.5 MG tablet Take 0.5 mg by mouth at bedtime.      amLODipine  (NORVASC ) 10 MG tablet Take 10 mg by mouth daily.     atenolol  (TENORMIN ) 100 MG tablet Take 100 mg by mouth daily.      Biotin 5000 MCG CAPS Take 1 capsule by mouth daily.     colestipol (COLESTID) 1 g tablet Take 2 g by mouth in the morning, at noon, and at bedtime.     dicyclomine (BENTYL) 20 MG tablet Take 1-2 tablets by mouth daily as needed for spasms.     esomeprazole (NEXIUM) 40 MG capsule Take 40 mg by mouth 2 (two) times daily before a meal.     estradiol (ESTRACE) 2 MG tablet Take 2 mg by mouth daily.      Evolocumab  (REPATHA  SURECLICK) 140 MG/ML SOAJ INJECT UNDER THE SKIN EVERY 14 DAYS 6 mL 3   fenofibrate  160 MG tablet TAKE 1 TABLET BY MOUTH DAILY 90 tablet 3   icosapent  Ethyl (VASCEPA ) 1 g capsule Take 2 capsules (2 g total) by mouth 2 (two) times daily. 360 capsule 3   indapamide  (LOZOL ) 2.5 MG tablet Take 2.5 mg by mouth daily.     levothyroxine  (SYNTHROID , LEVOTHROID) 50 MCG tablet Take 50 mcg by mouth daily before breakfast.      metoCLOPramide (REGLAN) 10 MG tablet Take 10 mg by mouth as needed for nausea (Take one tablet every 4 to 6 hours with migraine headaches as directed for her migrains).     ramipril  (ALTACE ) 10 MG capsule TAKE 1 CAPSULE(10 MG) BY MOUTH TWICE DAILY 180 capsule 3   sertraline  (ZOLOFT ) 100 MG tablet Take 100 mg by mouth daily.      spironolactone  (ALDACTONE ) 25 MG tablet Take 12.5 mg by mouth  daily.     tiZANidine  (ZANAFLEX ) 4 MG tablet Take 4 mg by mouth as needed for muscle spasms.     No current facility-administered medications for this visit.    PAST MEDICAL HISTORY: Past Medical History:  Diagnosis Date   AAA (abdominal aortic aneurysm)    Aortic atherosclerosis    Chronic kidney disease, stage 3a (HCC)    Complete hearing loss of right ear    Diabetes mellitus without complication (HCC)    Type 2    Esophageal reflux    Hyperlipidemia    Hypertension    Hypertriglyceridemia    Migraine    PAD (peripheral artery disease)     PAST SURGICAL HISTORY: Past Surgical History:  Procedure Laterality Date   APPENDECTOMY     hysterectomy, removal of right ovary     LAPAROTOMY     ORTHOPEDIC SURGERY      FAMILY HISTORY: Family History  Problem Relation Age of Onset   Thyroid  disease Mother    Hypertension Mother    Dementia Mother    Hypertension Father    Hyperlipidemia Father    Cancer - Prostate Father    Cancer - Other Maternal Grandmother    Cancer - Other Paternal Grandmother    Hypertension Paternal Grandfather     SOCIAL  HISTORY: Social History   Socioeconomic History   Marital status: Married    Spouse name: Not on file   Number of children: Not on file   Years of education: Not on file   Highest education level: Not on file  Occupational History   Not on file  Tobacco Use   Smoking status: Some Days    Current packs/day: 0.25    Average packs/day: 0.3 packs/day for 15.0 years (3.8 ttl pk-yrs)    Types: Cigarettes   Smokeless tobacco: Never  Vaping Use   Vaping status: Never Used  Substance and Sexual Activity   Alcohol use: Yes    Comment: Pt reports a few glasses of wine a night   Drug use: No   Sexual activity: Not on file  Other Topics Concern   Not on file  Social History Narrative   Not on file   Social Drivers of Health   Financial Resource Strain: Not on file  Food Insecurity: No Food Insecurity (11/27/2023)   Hunger Vital Sign    Worried About Running Out of Food in the Last Year: Never true    Ran Out of Food in the Last Year: Never true  Transportation Needs: No Transportation Needs (11/27/2023)   PRAPARE - Administrator, Civil Service (Medical): No    Lack of Transportation (Non-Medical): No  Physical Activity: Not on file  Stress: Not on file  Social Connections: Moderately Integrated (11/27/2023)   Social Connection and Isolation Panel    Frequency of Communication with Friends and Family: More than three times a week    Frequency of Social Gatherings with Friends and Family: More than three times a week    Attends Religious Services: 1 to 4 times per year    Active Member of [employer] or Organizations: No    Attends Banker Meetings: Never    Marital Status: Married  Catering Manager Violence: Not At Risk (11/27/2023)   Humiliation, Afraid, Rape, and Kick questionnaire    Fear of Current or Ex-Partner: No    Emotionally Abused: No    Physically Abused: No    Sexually Abused: No  Meshach Perry, M.D. Ph.D.  [facility] [address] [place], [place] [address] Ph: [phone] Fax: [phone]  CC:  Claudene Pellet, MD [phone] [address] [place],  [place] [address]  Claudene Pellet, MD

## 2024-08-09 ENCOUNTER — Telehealth: Payer: Self-pay | Admitting: Neurology

## 2024-08-09 ENCOUNTER — Ambulatory Visit: Payer: Self-pay | Admitting: Neurology

## 2024-08-09 LAB — THYROID PANEL WITH TSH
Free Thyroxine Index: 1.7 (ref 1.2–4.9)
T3 Uptake Ratio: 27 % (ref 24–39)
T4, Total: 6.3 ug/dL (ref 4.5–12.0)
TSH: 2.25 u[IU]/mL (ref 0.450–4.500)

## 2024-08-09 LAB — SEDIMENTATION RATE: Sed Rate: 3 mm/h (ref 0–40)

## 2024-08-09 LAB — VITAMIN B12: Vitamin B-12: >2000 pg/mL — ABNORMAL HIGH (ref 232–1245)

## 2024-08-09 LAB — C-REACTIVE PROTEIN: CRP: <1 mg/L (ref 0–10)

## 2024-08-09 LAB — ANA W/REFLEX IF POSITIVE: Anti Nuclear Antibody (ANA): NEGATIVE

## 2024-08-09 LAB — CK: Total CK: 146 U/L (ref 32–182)

## 2024-08-09 NOTE — Telephone Encounter (Signed)
 no auth required sent to [facility] [phone]

## 2024-08-29 ENCOUNTER — Ambulatory Visit: Attending: Cardiology | Admitting: Cardiology

## 2024-08-29 ENCOUNTER — Encounter: Payer: Self-pay | Admitting: Cardiology

## 2024-08-29 VITALS — BP 106/68 | HR 55 | Ht 66.0 in | Wt 117.2 lb

## 2024-08-29 DIAGNOSIS — Z720 Tobacco use: Secondary | ICD-10-CM | POA: Diagnosis not present

## 2024-08-29 DIAGNOSIS — E782 Mixed hyperlipidemia: Secondary | ICD-10-CM

## 2024-08-29 DIAGNOSIS — I10 Essential (primary) hypertension: Secondary | ICD-10-CM

## 2024-08-29 DIAGNOSIS — I739 Peripheral vascular disease, unspecified: Secondary | ICD-10-CM | POA: Diagnosis not present

## 2024-08-29 NOTE — Patient Instructions (Signed)

## 2024-08-29 NOTE — Progress Notes (Signed)
 Cardiology Office Note:  .   Date:  08/29/2024  ID:  Darlene Saunders, DOB [birth year], MRN [id number] PCP: Claudene Pellet, MD  [facility] Providers Cardiologist:  Oneil Parchment, MD     History of Present Illness: .   Darlene Saunders is a 68 y.o. female Discussed the use of AI scribe   History of Present Illness Darlene Saunders is a 68 year old female with abdominal aortic aneurysm and peripheral artery disease who presents for a one year follow-up.  She has a history of a 3.2 cm abdominal aortic aneurysm (AAA) first identified in 2021, which has remained stable in size as of December 2024. Her family history is significant for AAA, with her father and his sister having had similar conditions.  She has peripheral artery disease, with lower ankle-brachial indices indicating plaque buildup, though blood flow remains normal. She is on Repatha , which has reduced her LDL levels to 15 as of June 2025. She is working towards smoking cessation, acknowledging the difficulty of quitting completely.  She experiences back pain and leg weakness over the past six to eight months, attributed to a vertebral problem.  Her current medications include Repatha , fenofibrate , Vascepa  (icosapent  ethyl), amlodipine  10 mg, and atenolol  100 mg. She finds the Vascepa  pills large and difficult to take but continues to adhere to her medication regimen.      Studies Reviewed: .        Results LABS LDL: 15 (02/2024)  RADIOLOGY Renal duplex: 3.2 cm AAA (08/2023)  DIAGNOSTIC ABI: Lower ABIs indicated evidence of atherosclerosis. Blood flow is normal. (05/06/2024) Risk Assessment/Calculations:            Physical Exam:   VS:  BP 106/68 (BP Location: Left Arm, Patient Position: Sitting, Cuff Size: Normal)   Pulse (!) 55   Ht 5' 6 (1.676 m)   Wt 117 lb 3.2 oz (53.2 kg)   SpO2 99%   BMI 18.92 kg/m    Wt Readings from Last 3 Encounters:  08/29/24 117 lb 3.2 oz (53.2 kg)  08/08/24 118 lb 8 oz (53.8 kg)   04/28/24 119 lb (54 kg)    GEN: Well nourished, thin, developed in no acute distress NECK: No JVD; No carotid bruits CARDIAC: RRR, no murmurs, no rubs, no gallops RESPIRATORY:  Clear to auscultation without rales, wheezing or rhonchi  ABDOMEN: Soft, non-tender, non-distended EXTREMITIES:  No edema; No deformity   ASSESSMENT AND PLAN: .    Assessment and Plan Assessment & Plan Stable abdominal aortic aneurysm, 3.2 cm Abdominal aortic aneurysm remains stable at 3.2 cm with no change in size since December 2024. Family history of aneurysms noted. No acute symptoms reported. - Continue monitoring with vascular surgery  Peripheral artery disease with plaque and normal blood flow Peripheral artery disease with plaque buildup in the legs, but blood flow remains normal. No acute symptoms reported. - Continue current management  Hyperlipidemia on Repatha , fenofibrate , and Vascepa  Hyperlipidemia well-controlled with Repatha , fenofibrate , and Vascepa . LDL levels reduced to 15 in June, indicating effective management of cholesterol and plaque stabilization. No adverse effects reported from Repatha . - Continue Repatha , fenofibrate , and Vascepa   Hypertension on amlodipine  and atenolol  Hypertension well-controlled with amlodipine  and atenolol . Blood pressure readings are stable. - Continue amlodipine  and atenolol   Nicotine dependence, working towards cessation Nicotine dependence with ongoing efforts towards smoking cessation. Smoking has decreased, but not completely ceased. She is motivated to quit smoking. - Encouraged smoking cessation efforts  1 yr  Signed, Oneil Parchment, MD
